# Patient Record
Sex: Male | Born: 1985 | Race: Black or African American | Hispanic: No | Marital: Married | Smoking: Former smoker
Health system: Southern US, Community
[De-identification: ages and names within clinical notes are randomized; demographics above are authoritative.]

## PROBLEM LIST (undated history)

## (undated) DIAGNOSIS — I10 Essential (primary) hypertension: Secondary | ICD-10-CM

## (undated) DIAGNOSIS — R7303 Prediabetes: Secondary | ICD-10-CM

---
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
## 2006-06-06 ENCOUNTER — Other Ambulatory Visit: Payer: Self-pay

## 2006-06-06 ENCOUNTER — Emergency Department: Payer: Self-pay | Admitting: Emergency Medicine

## 2007-10-25 ENCOUNTER — Emergency Department: Payer: Self-pay | Admitting: Emergency Medicine

## 2008-06-29 ENCOUNTER — Emergency Department: Payer: Self-pay | Admitting: Emergency Medicine

## 2008-08-26 ENCOUNTER — Emergency Department: Payer: Self-pay | Admitting: Emergency Medicine

## 2009-03-27 ENCOUNTER — Emergency Department: Payer: Self-pay | Admitting: Emergency Medicine

## 2009-08-21 ENCOUNTER — Emergency Department: Payer: Self-pay | Admitting: Unknown Physician Specialty

## 2010-12-24 ENCOUNTER — Emergency Department: Payer: Self-pay | Admitting: Emergency Medicine

## 2011-03-02 ENCOUNTER — Emergency Department: Payer: Self-pay | Admitting: *Deleted

## 2011-11-15 ENCOUNTER — Emergency Department: Payer: Self-pay | Admitting: Emergency Medicine

## 2011-11-17 LAB — BETA STREP CULTURE(ARMC)

## 2013-03-24 ENCOUNTER — Emergency Department: Payer: Self-pay | Admitting: Emergency Medicine

## 2013-07-16 ENCOUNTER — Emergency Department: Payer: Self-pay | Admitting: Emergency Medicine

## 2013-08-28 ENCOUNTER — Emergency Department: Payer: Self-pay | Admitting: Emergency Medicine

## 2013-08-28 LAB — URINALYSIS, COMPLETE
Bacteria: NONE SEEN
Bilirubin,UR: NEGATIVE
Blood: NEGATIVE
Glucose,UR: NEGATIVE mg/dL (ref 0–75)
Ketone: NEGATIVE
Leukocyte Esterase: NEGATIVE
Nitrite: NEGATIVE
PROTEIN: NEGATIVE
Ph: 5 (ref 4.5–8.0)
RBC, UR: NONE SEEN /HPF (ref 0–5)
Specific Gravity: 1.034 (ref 1.003–1.030)
Squamous Epithelial: <1

## 2013-08-28 LAB — COMPREHENSIVE METABOLIC PANEL
ANION GAP: 5 — AB (ref 7–16)
Albumin: 4 g/dL (ref 3.4–5.0)
Alkaline Phosphatase: 77 U/L
BUN: 22 mg/dL — ABNORMAL HIGH (ref 7–18)
Bilirubin,Total: 0.3 mg/dL (ref 0.2–1.0)
CALCIUM: 8.9 mg/dL (ref 8.5–10.1)
CO2: 28 mmol/L (ref 21–32)
Chloride: 104 mmol/L (ref 98–107)
Creatinine: 0.97 mg/dL (ref 0.60–1.30)
EGFR (African American): >60
EGFR (Non-African Amer.): >60
Glucose: 98 mg/dL (ref 65–99)
OSMOLALITY: 277 (ref 275–301)
POTASSIUM: 3.9 mmol/L (ref 3.5–5.1)
SGOT(AST): 21 U/L (ref 15–37)
SGPT (ALT): 33 U/L (ref 12–78)
Sodium: 137 mmol/L (ref 136–145)
Total Protein: 8.4 g/dL — ABNORMAL HIGH (ref 6.4–8.2)

## 2013-08-28 LAB — CBC WITH DIFFERENTIAL/PLATELET
BASOS ABS: 0 10*3/uL (ref 0.0–0.1)
BASOS PCT: 0.7 %
Eosinophil #: 0.3 10*3/uL (ref 0.0–0.7)
Eosinophil %: 3.7 %
HCT: 47 % (ref 40.0–52.0)
HGB: 15 g/dL (ref 13.0–18.0)
Lymphocyte #: 1.9 10*3/uL (ref 1.0–3.6)
Lymphocyte %: 27.9 %
MCH: 27.9 pg (ref 26.0–34.0)
MCHC: 32 g/dL (ref 32.0–36.0)
MCV: 87 fL (ref 80–100)
Monocyte #: 0.7 x10 3/mm (ref 0.2–1.0)
Monocyte %: 9.9 %
Neutrophil #: 4 10*3/uL (ref 1.4–6.5)
Neutrophil %: 57.8 %
Platelet: 239 10*3/uL (ref 150–440)
RBC: 5.38 10*6/uL (ref 4.40–5.90)
RDW: 14.3 % (ref 11.5–14.5)
WBC: 6.9 10*3/uL (ref 3.8–10.6)

## 2013-08-28 LAB — CK TOTAL AND CKMB (NOT AT ARMC)
CK, TOTAL: 370 U/L — AB
CK-MB: 3 ng/mL (ref 0.5–3.6)

## 2013-10-18 ENCOUNTER — Emergency Department: Payer: Self-pay | Admitting: Emergency Medicine

## 2013-10-18 LAB — URINALYSIS, COMPLETE
BACTERIA: NONE SEEN
Bilirubin,UR: NEGATIVE
Blood: NEGATIVE
GLUCOSE, UR: NEGATIVE mg/dL (ref 0–75)
Ketone: NEGATIVE
Leukocyte Esterase: NEGATIVE
Nitrite: NEGATIVE
Ph: 6 (ref 4.5–8.0)
Protein: NEGATIVE
RBC,UR: NONE SEEN /HPF (ref 0–5)
SPECIFIC GRAVITY: 1.017 (ref 1.003–1.030)
Squamous Epithelial: NONE SEEN
WBC UR: 1 /HPF (ref 0–5)

## 2013-10-18 LAB — DRUG SCREEN, URINE
Amphetamines, Ur Screen: NEGATIVE (ref ?–1000)
Barbiturates, Ur Screen: NEGATIVE (ref ?–200)
Benzodiazepine, Ur Scrn: NEGATIVE (ref ?–200)
CANNABINOID 50 NG, UR ~~LOC~~: POSITIVE (ref ?–50)
COCAINE METABOLITE, UR ~~LOC~~: NEGATIVE (ref ?–300)
MDMA (ECSTASY) UR SCREEN: NEGATIVE (ref ?–500)
Methadone, Ur Screen: NEGATIVE (ref ?–300)
Opiate, Ur Screen: NEGATIVE (ref ?–300)
Phencyclidine (PCP) Ur S: NEGATIVE (ref ?–25)
Tricyclic, Ur Screen: NEGATIVE (ref ?–1000)

## 2013-10-18 LAB — CBC
HCT: 45.6 % (ref 40.0–52.0)
HGB: 14.8 g/dL (ref 13.0–18.0)
MCH: 28.1 pg (ref 26.0–34.0)
MCHC: 32.5 g/dL (ref 32.0–36.0)
MCV: 87 fL (ref 80–100)
PLATELETS: 243 10*3/uL (ref 150–440)
RBC: 5.26 10*6/uL (ref 4.40–5.90)
RDW: 14.2 % (ref 11.5–14.5)
WBC: 8 10*3/uL (ref 3.8–10.6)

## 2013-10-18 LAB — COMPREHENSIVE METABOLIC PANEL
ANION GAP: 6 — AB (ref 7–16)
AST: 14 U/L — AB (ref 15–37)
Albumin: 4 g/dL (ref 3.4–5.0)
Alkaline Phosphatase: 75 U/L
BUN: 14 mg/dL (ref 7–18)
Bilirubin,Total: 0.3 mg/dL (ref 0.2–1.0)
CALCIUM: 9.3 mg/dL (ref 8.5–10.1)
CO2: 26 mmol/L (ref 21–32)
Chloride: 106 mmol/L (ref 98–107)
Creatinine: 1.18 mg/dL (ref 0.60–1.30)
EGFR (African American): >60
Glucose: 99 mg/dL (ref 65–99)
Osmolality: 276 (ref 275–301)
POTASSIUM: 3.3 mmol/L — AB (ref 3.5–5.1)
SGPT (ALT): 31 U/L
SODIUM: 138 mmol/L (ref 136–145)
Total Protein: 8.5 g/dL — ABNORMAL HIGH (ref 6.4–8.2)

## 2013-10-19 ENCOUNTER — Emergency Department: Payer: Self-pay | Admitting: Emergency Medicine

## 2013-10-19 LAB — CBC WITH DIFFERENTIAL/PLATELET
BASOS ABS: 0 10*3/uL (ref 0.0–0.1)
BASOS PCT: 0.6 %
Eosinophil #: 0.1 10*3/uL (ref 0.0–0.7)
Eosinophil %: 0.7 %
HCT: 46.9 % (ref 40.0–52.0)
HGB: 15.2 g/dL (ref 13.0–18.0)
Lymphocyte #: 1.9 10*3/uL (ref 1.0–3.6)
Lymphocyte %: 23.3 %
MCH: 28 pg (ref 26.0–34.0)
MCHC: 32.5 g/dL (ref 32.0–36.0)
MCV: 86 fL (ref 80–100)
MONO ABS: 0.6 x10 3/mm (ref 0.2–1.0)
Monocyte %: 7.6 %
NEUTROS PCT: 67.8 %
Neutrophil #: 5.6 10*3/uL (ref 1.4–6.5)
Platelet: 237 10*3/uL (ref 150–440)
RBC: 5.43 10*6/uL (ref 4.40–5.90)
RDW: 14 % (ref 11.5–14.5)
WBC: 8.3 10*3/uL (ref 3.8–10.6)

## 2013-10-19 LAB — COMPREHENSIVE METABOLIC PANEL
ALBUMIN: 4.1 g/dL (ref 3.4–5.0)
AST: 16 U/L (ref 15–37)
Alkaline Phosphatase: 73 U/L
Anion Gap: 7 (ref 7–16)
BUN: 8 mg/dL (ref 7–18)
Bilirubin,Total: 0.4 mg/dL (ref 0.2–1.0)
CHLORIDE: 107 mmol/L (ref 98–107)
Calcium, Total: 9.5 mg/dL (ref 8.5–10.1)
Co2: 26 mmol/L (ref 21–32)
Creatinine: 1.09 mg/dL (ref 0.60–1.30)
EGFR (Non-African Amer.): >60
GLUCOSE: 114 mg/dL — AB (ref 65–99)
OSMOLALITY: 279 (ref 275–301)
Potassium: 3.2 mmol/L — ABNORMAL LOW (ref 3.5–5.1)
SGPT (ALT): 27 U/L
Sodium: 140 mmol/L (ref 136–145)
TOTAL PROTEIN: 8.6 g/dL — AB (ref 6.4–8.2)

## 2013-10-19 LAB — TROPONIN I: Troponin-I: <0.02 ng/mL

## 2013-10-23 ENCOUNTER — Encounter: Payer: Self-pay | Admitting: *Deleted

## 2013-10-23 ENCOUNTER — Ambulatory Visit: Payer: Self-pay | Admitting: Cardiovascular Disease

## 2013-10-29 ENCOUNTER — Emergency Department: Payer: Self-pay | Admitting: Emergency Medicine

## 2013-10-29 LAB — CBC WITH DIFFERENTIAL/PLATELET
BASOS ABS: 0 10*3/uL (ref 0.0–0.1)
BASOS PCT: 0.5 %
EOS ABS: 0.1 10*3/uL (ref 0.0–0.7)
EOS PCT: 1.7 %
HCT: 47.6 % (ref 40.0–52.0)
HGB: 15.4 g/dL (ref 13.0–18.0)
LYMPHS ABS: 1.5 10*3/uL (ref 1.0–3.6)
LYMPHS PCT: 23.2 %
MCH: 28.1 pg (ref 26.0–34.0)
MCHC: 32.3 g/dL (ref 32.0–36.0)
MCV: 87 fL (ref 80–100)
MONO ABS: 0.6 x10 3/mm (ref 0.2–1.0)
Monocyte %: 8.7 %
NEUTROS ABS: 4.3 10*3/uL (ref 1.4–6.5)
Neutrophil %: 65.9 %
Platelet: 268 10*3/uL (ref 150–440)
RBC: 5.47 10*6/uL (ref 4.40–5.90)
RDW: 14.1 % (ref 11.5–14.5)
WBC: 6.5 10*3/uL (ref 3.8–10.6)

## 2013-10-29 LAB — URINALYSIS, COMPLETE
BACTERIA: NONE SEEN
Bilirubin,UR: NEGATIVE
Blood: NEGATIVE
GLUCOSE, UR: NEGATIVE mg/dL (ref 0–75)
Ketone: NEGATIVE
Leukocyte Esterase: NEGATIVE
Nitrite: NEGATIVE
PH: 7 (ref 4.5–8.0)
Protein: NEGATIVE
RBC,UR: NONE SEEN /HPF (ref 0–5)
SPECIFIC GRAVITY: 1.001 (ref 1.003–1.030)
SQUAMOUS EPITHELIAL: NONE SEEN
WBC UR: NONE SEEN /HPF (ref 0–5)

## 2013-10-29 LAB — BASIC METABOLIC PANEL
Anion Gap: 7 (ref 7–16)
BUN: 9 mg/dL (ref 7–18)
CO2: 26 mmol/L (ref 21–32)
CREATININE: 1.05 mg/dL (ref 0.60–1.30)
Calcium, Total: 9.2 mg/dL (ref 8.5–10.1)
Chloride: 105 mmol/L (ref 98–107)
EGFR (African American): >60
GLUCOSE: 94 mg/dL (ref 65–99)
OSMOLALITY: 274 (ref 275–301)
POTASSIUM: 3.6 mmol/L (ref 3.5–5.1)
Sodium: 138 mmol/L (ref 136–145)

## 2013-10-29 LAB — TROPONIN I: Troponin-I: <0.02 ng/mL

## 2013-11-10 ENCOUNTER — Encounter (INDEPENDENT_AMBULATORY_CARE_PROVIDER_SITE_OTHER): Payer: Self-pay

## 2013-11-10 ENCOUNTER — Ambulatory Visit (INDEPENDENT_AMBULATORY_CARE_PROVIDER_SITE_OTHER): Payer: Self-pay | Admitting: Cardiovascular Disease

## 2013-11-10 ENCOUNTER — Encounter: Payer: Self-pay | Admitting: Cardiovascular Disease

## 2013-11-10 VITALS — BP 138/92 | HR 74 | Ht 75.0 in | Wt 246.8 lb

## 2013-11-10 DIAGNOSIS — Z7189 Other specified counseling: Secondary | ICD-10-CM

## 2013-11-10 DIAGNOSIS — R001 Bradycardia, unspecified: Secondary | ICD-10-CM

## 2013-11-10 DIAGNOSIS — R0789 Other chest pain: Secondary | ICD-10-CM

## 2013-11-10 DIAGNOSIS — R42 Dizziness and giddiness: Secondary | ICD-10-CM

## 2013-11-10 DIAGNOSIS — I498 Other specified cardiac arrhythmias: Secondary | ICD-10-CM

## 2013-11-10 DIAGNOSIS — IMO0001 Reserved for inherently not codable concepts without codable children: Secondary | ICD-10-CM | POA: Insufficient documentation

## 2013-11-10 DIAGNOSIS — R030 Elevated blood-pressure reading, without diagnosis of hypertension: Secondary | ICD-10-CM

## 2013-11-10 DIAGNOSIS — Z7689 Persons encountering health services in other specified circumstances: Secondary | ICD-10-CM

## 2013-11-10 DIAGNOSIS — I499 Cardiac arrhythmia, unspecified: Secondary | ICD-10-CM

## 2013-11-10 NOTE — Assessment & Plan Note (Signed)
The patient was reported to have bradycardia in the emergency room. He has been having lightheadedness of unclear etiology. The lightheadedness could be completely unrelated. However, in order to establish the connection, I requested a 48-hour Holter monitor given that his symptoms are almost daily. I strongly advised him to establish with a primary care physician to followup on his high blood pressure and other medical issues or etiologies for his lightheadedness. His cardiac exam is normal and baseline ECG is unremarkable.

## 2013-11-10 NOTE — Patient Instructions (Signed)
Your physician has recommended that you wear a holter monitor. Holter monitors are medical devices that record the heart's electrical activity. Doctors most often use these monitors to diagnose arrhythmias. Arrhythmias are problems with the speed or rhythm of the heartbeat. The monitor is a small, portable device. You can wear one while you do your normal daily activities. This is usually used to diagnose what is causing palpitations/syncope (passing out).  You have been referred to the [facility]. I will call you with the date and time.   Please follow low sodium diet instructions   Your physician recommends that you schedule a follow-up appointment in:  As needed with Dr. Kirke Corin

## 2013-11-10 NOTE — Assessment & Plan Note (Signed)
I discussed with the patient the importance of low sodium diet, regular exercise and weight loss. If blood pressure continues to be elevated after a period of lifestyle changes, medical therapy is warranted. He was provided with information about low-sodium diet.

## 2013-11-10 NOTE — Progress Notes (Signed)
HPI  This is a 28 year old African American man who was referred from the emergency room at [facility] for evaluation of dizziness and bradycardia. He is not aware of any previous cardiac history or chronic medical conditions. He has not seen a physician in many years. He has noticed elevated blood pressure every time he goes to the emergency room but has not been on any medications. He quit smoking one month ago and used to smoke one pack per day. He drinks alcohol occasionally and uses marijuana as well. He denies any other recreational drug use. He is currently unemployed. He denies any family history of premature coronary artery disease. He went to the emergency room recently due to dizziness described as lightheadedness with no palpitations, chest pain or shortness of breath. He had no syncope or presyncope. He was noted to have fluctuating heart rate while on the monitor with episodes of bradycardia which was not recorded. He was thus referred to [place] for evaluation. His labs were overall unremarkable except for mild hypokalemia with a potassium of 3.3. Total protein was borderline elevated at 8.5. Drug screen was negative. Troponin was normal. EKG showed no acute changes. Chest x-ray was unremarkable.  No Known Allergies   No current outpatient prescriptions on file prior to visit.   No current facility-administered medications on file prior to visit.     History reviewed. No pertinent past medical history.   History reviewed. No pertinent past surgical history.   Family History  Problem Relation Age of Onset  . Asthma Brother      History   Social History  . Marital Status: Single    Spouse Name: N/A    Number of Children: N/A  . Years of Education: N/A   Occupational History  . Not on file.   Social History Main Topics  . Smoking status: Former Smoker -- 1.00 packs/day for 10 years    Types: Cigarettes  . Smokeless tobacco: Not on file  . Alcohol Use: Yes     Comment:  rare   . Drug Use: Not on file     Comment: Patient recently quit smoking marjuana in July 2015; smoked marjuana since high school.  . Sexual Activity: Not on file   Other Topics Concern  . Not on file   Social History Narrative  . No narrative on file     ROS A 10 point review of system was performed. It is negative other than that mentioned in the history of present illness.   PHYSICAL EXAM   BP 138/92  Pulse 74  Ht  (1.905 m)  Wt 246 lb 12 oz (111.925 kg)  BMI 30.84 kg/m2 Constitutional: He is oriented to person, place, and time. He appears well-developed and well-nourished. No distress.  HENT: No nasal discharge.  Head: Normocephalic and atraumatic.  Eyes: Pupils are equal and round.  No discharge. Neck: Normal range of motion. Neck supple. No JVD present. No thyromegaly present.  Cardiovascular: Normal rate, regular rhythm, normal heart sounds. Exam reveals no gallop and no friction rub. No murmur heard.  Pulmonary/Chest: Effort normal and breath sounds normal. No stridor. No respiratory distress. He has no wheezes. He has no rales. He exhibits no tenderness.  Abdominal: Soft. Bowel sounds are normal. He exhibits no distension. There is no tenderness. There is no rebound and no guarding.  Musculoskeletal: Normal range of motion. He exhibits no edema and no tenderness.  Neurological: He is alert and oriented to person, place, and time.  Coordination normal.  Skin: Skin is warm and dry. No rash noted. He is not diaphoretic. No erythema. No pallor.  Psychiatric: He has a normal mood and affect. His behavior is normal. Judgment and thought content normal.       ZOX:WRUEAV sinus rhythm Normal EKG   ASSESSMENT AND PLAN

## 2013-11-11 ENCOUNTER — Telehealth: Payer: Self-pay | Admitting: *Deleted

## 2013-11-11 NOTE — Telephone Encounter (Signed)
LVM at [facility] to refer patient

## 2013-11-13 DIAGNOSIS — I498 Other specified cardiac arrhythmias: Secondary | ICD-10-CM

## 2013-12-03 NOTE — Telephone Encounter (Signed)
LVM at [facility] 9/23

## 2013-12-10 ENCOUNTER — Telehealth: Payer: Self-pay | Admitting: *Deleted

## 2013-12-10 NOTE — Telephone Encounter (Signed)
Patient returned call. Patient verbalized understanding.

## 2013-12-10 NOTE — Telephone Encounter (Signed)
LVM for patient to inform him that per Dr. Kirke CorinArida his event monitor showed:  NSR with Sinus arrythmia and occasional pac's  Mild sinus bradycardia in the morning hours  No significant arrythmias

## 2013-12-10 NOTE — Telephone Encounter (Signed)
Patient has appt with [facility] 12/30/13 at 1:30

## 2013-12-11 ENCOUNTER — Encounter: Payer: Self-pay | Admitting: *Deleted

## 2013-12-12 ENCOUNTER — Other Ambulatory Visit: Payer: Self-pay

## 2013-12-12 ENCOUNTER — Encounter (INDEPENDENT_AMBULATORY_CARE_PROVIDER_SITE_OTHER): Payer: Self-pay

## 2013-12-12 DIAGNOSIS — R001 Bradycardia, unspecified: Secondary | ICD-10-CM

## 2014-03-19 ENCOUNTER — Emergency Department: Payer: Self-pay | Admitting: Student

## 2014-06-16 ENCOUNTER — Emergency Department: Admit: 2014-06-16 | Disposition: A | Discharge status: Home / Self Care | Payer: Self-pay | Admitting: Student

## 2014-08-23 ENCOUNTER — Encounter: Payer: Self-pay | Admitting: Emergency Medicine

## 2014-08-23 DIAGNOSIS — Z87891 Personal history of nicotine dependence: Secondary | ICD-10-CM | POA: Insufficient documentation

## 2014-08-23 DIAGNOSIS — R1030 Lower abdominal pain, unspecified: Secondary | ICD-10-CM | POA: Insufficient documentation

## 2014-08-23 DIAGNOSIS — E860 Dehydration: Secondary | ICD-10-CM | POA: Insufficient documentation

## 2014-08-23 NOTE — ED Notes (Signed)
Abdominal pain, dry mouth, feeling of dehydration, clear urine. Mid lower abdominal pain.  Started 1 hour ago.  CBG 93.  VSS

## 2014-08-24 ENCOUNTER — Emergency Department
Admission: EM | Admit: 2014-08-24 | Discharge: 2014-08-24 | Discharge status: Against Medical Advice | Payer: Self-pay | Attending: Emergency Medicine | Admitting: Emergency Medicine

## 2014-08-24 HISTORY — DX: Prediabetes: R73.03

## 2014-08-24 LAB — URINALYSIS COMPLETE WITH MICROSCOPIC (ARMC ONLY)
Bacteria, UA: NONE SEEN
Bilirubin Urine: NEGATIVE
Glucose, UA: NEGATIVE mg/dL
Hgb urine dipstick: NEGATIVE
Ketones, ur: NEGATIVE mg/dL
LEUKOCYTES UA: NEGATIVE
Nitrite: NEGATIVE
Protein, ur: NEGATIVE mg/dL
SQUAMOUS EPITHELIAL / LPF: NONE SEEN
Specific Gravity, Urine: 1.019 (ref 1.005–1.030)
pH: 6 (ref 5.0–8.0)

## 2014-08-24 LAB — CBC WITH DIFFERENTIAL/PLATELET
BASOS ABS: 0 10*3/uL (ref 0–0.1)
Basophils Relative: 1 %
EOS PCT: 2 %
Eosinophils Absolute: 0.1 10*3/uL (ref 0–0.7)
HCT: 47.4 % (ref 40.0–52.0)
HEMOGLOBIN: 15.5 g/dL (ref 13.0–18.0)
LYMPHS ABS: 2.8 10*3/uL (ref 1.0–3.6)
Lymphocytes Relative: 52 %
MCH: 27.5 pg (ref 26.0–34.0)
MCHC: 32.7 g/dL (ref 32.0–36.0)
MCV: 84 fL (ref 80.0–100.0)
Monocytes Absolute: 0.6 10*3/uL (ref 0.2–1.0)
Monocytes Relative: 12 %
NEUTROS PCT: 33 %
Neutro Abs: 1.7 10*3/uL (ref 1.4–6.5)
Platelets: 234 10*3/uL (ref 150–440)
RBC: 5.64 MIL/uL (ref 4.40–5.90)
RDW: 15 % — AB (ref 11.5–14.5)
WBC: 5.3 10*3/uL (ref 3.8–10.6)

## 2014-08-24 LAB — COMPREHENSIVE METABOLIC PANEL
ALBUMIN: 4.5 g/dL (ref 3.5–5.0)
ALT: 42 U/L (ref 17–63)
AST: 33 U/L (ref 15–41)
Alkaline Phosphatase: 77 U/L (ref 38–126)
Anion gap: 8 (ref 5–15)
BUN: 19 mg/dL (ref 6–20)
CALCIUM: 9.6 mg/dL (ref 8.9–10.3)
CO2: 25 mmol/L (ref 22–32)
Chloride: 105 mmol/L (ref 101–111)
Creatinine, Ser: 1.05 mg/dL (ref 0.61–1.24)
GFR calc non Af Amer: >60 mL/min (ref >60)
GLUCOSE: 105 mg/dL — AB (ref 65–99)
Potassium: 3.7 mmol/L (ref 3.5–5.1)
Sodium: 138 mmol/L (ref 135–145)
TOTAL PROTEIN: 8.4 g/dL — AB (ref 6.5–8.1)
Total Bilirubin: 0.5 mg/dL (ref 0.3–1.2)

## 2014-08-24 LAB — LIPASE, BLOOD: Lipase: 57 U/L — ABNORMAL HIGH (ref 22–51)

## 2014-08-24 LAB — GLUCOSE, CAPILLARY: Glucose-Capillary: 93 mg/dL (ref 65–99)

## 2014-08-25 ENCOUNTER — Telehealth: Payer: Self-pay | Admitting: Emergency Medicine

## 2014-08-25 NOTE — ED Notes (Signed)
Called patient due to lwot to inquire about condition and follow up plans.  The person who answered was trying to get patient to come to phone, but he never came.  Phone got cut off.

## 2015-02-10 ENCOUNTER — Emergency Department
Admission: EM | Admit: 2015-02-10 | Discharge: 2015-02-10 | Disposition: A | Discharge status: Home / Self Care | Payer: Self-pay | Attending: Emergency Medicine | Admitting: Emergency Medicine

## 2015-02-10 DIAGNOSIS — Y9389 Activity, other specified: Secondary | ICD-10-CM | POA: Insufficient documentation

## 2015-02-10 DIAGNOSIS — Z87891 Personal history of nicotine dependence: Secondary | ICD-10-CM | POA: Insufficient documentation

## 2015-02-10 DIAGNOSIS — S39012A Strain of muscle, fascia and tendon of lower back, initial encounter: Secondary | ICD-10-CM | POA: Insufficient documentation

## 2015-02-10 DIAGNOSIS — Y9289 Other specified places as the place of occurrence of the external cause: Secondary | ICD-10-CM | POA: Insufficient documentation

## 2015-02-10 DIAGNOSIS — Z79899 Other long term (current) drug therapy: Secondary | ICD-10-CM | POA: Insufficient documentation

## 2015-02-10 DIAGNOSIS — Y990 Civilian activity done for income or pay: Secondary | ICD-10-CM | POA: Insufficient documentation

## 2015-02-10 DIAGNOSIS — X58XXXA Exposure to other specified factors, initial encounter: Secondary | ICD-10-CM | POA: Insufficient documentation

## 2015-02-10 MED ORDER — CYCLOBENZAPRINE HCL 10 MG PO TABS: 10.0000 mg | ORAL_TABLET | Freq: Three times a day (TID) | ORAL | Status: DC | PRN

## 2015-02-10 MED ORDER — IBUPROFEN 800 MG PO TABS: 800.0000 mg | ORAL_TABLET | Freq: Three times a day (TID) | ORAL | Status: DC | PRN

## 2015-02-10 NOTE — Discharge Instructions (Signed)
Muscle Strain  A muscle strain is an injury that occurs when a muscle is stretched beyond its normal length. Usually a small number of muscle fibers are torn when this happens. Muscle strain is rated in degrees. First-degree strains have the least amount of muscle fiber tearing and pain. Second-degree and third-degree strains have increasingly more tearing and pain.   Usually, recovery from muscle strain takes 1-2 weeks. Complete healing takes 5-6 weeks.   CAUSES   Muscle strain happens when a sudden, violent force placed on a muscle stretches it too far. This may occur with lifting, sports, or a fall.   RISK FACTORS  Muscle strain is especially common in athletes.   SIGNS AND SYMPTOMS  At the site of the muscle strain, there may be:   Pain.   Bruising.   Swelling.   Difficulty using the muscle due to pain or lack of normal function.  DIAGNOSIS   Your health care provider will perform a physical exam and ask about your medical history.  TREATMENT   Often, the best treatment for a muscle strain is resting, icing, and applying cold compresses to the injured area.   HOME CARE INSTRUCTIONS    Use the PRICE method of treatment to promote muscle healing during the first 2-3 days after your injury. The PRICE method involves:    Protecting the muscle from being injured again.    Restricting your activity and resting the injured body part.    Icing your injury. To do this, put ice in a plastic bag. Place a towel between your skin and the bag. Then, apply the ice and leave it on from 15-20 minutes each hour. After the third day, switch to moist heat packs.    Apply compression to the injured area with a splint or elastic bandage. Be careful not to wrap it too tightly. This may interfere with blood circulation or increase swelling.    Elevate the injured body part above the level of your heart as often as you can.   Only take over-the-counter or prescription medicines for pain, discomfort, or fever as directed by your  health care provider.   Warming up prior to exercise helps to prevent future muscle strains.  SEEK MEDICAL CARE IF:    You have increasing pain or swelling in the injured area.   You have numbness, tingling, or a significant loss of strength in the injured area.  MAKE SURE YOU:    Understand these instructions.   Will watch your condition.   Will get help right away if you are not doing well or get worse.     This information is not intended to replace advice given to you by your health care provider. Make sure you discuss any questions you have with your health care provider.     Document Released: 02/27/2005 Document Revised: 12/18/2012 Document Reviewed: 09/26/2012  Elsevier Interactive Patient Education 2016 Elsevier Inc[employer]    Lumbosacral Strain  Lumbosacral strain is a strain of any of the parts that make up your lumbosacral vertebrae. Your lumbosacral vertebrae are the bones that make up the lower third of your backbone. Your lumbosacral vertebrae are held together by muscles and tough, fibrous tissue (ligaments).   CAUSES   A sudden blow to your back can cause lumbosacral strain. Also, anything that causes an excessive stretch of the muscles in the low back can cause this strain. This is typically seen when people exert themselves strenuously, fall, lift heavy objects, bend, or crouch   repeatedly.  RISK FACTORS   Physically demanding work.   Participation in pushing or pulling sports or sports that require a sudden twist of the back (tennis, golf, baseball).   Weight lifting.   Excessive lower back curvature.   Forward-tilted pelvis.   Weak back or abdominal muscles or both.   Tight hamstrings.  SIGNS AND SYMPTOMS   Lumbosacral strain may cause pain in the area of your injury or pain that moves (radiates) down your leg.   DIAGNOSIS  Your health care provider can often diagnose lumbosacral strain through a physical exam. In some cases, you may need tests such as X-ray exams.   TREATMENT   Treatment  for your lower back injury depends on many factors that your clinician will have to evaluate. However, most treatment will include the use of anti-inflammatory medicines.  HOME CARE INSTRUCTIONS    Avoid hard physical activities (tennis, racquetball, waterskiing) if you are not in proper physical condition for it. This may aggravate or create problems.   If you have a back problem, avoid sports requiring sudden body movements. Swimming and walking are generally safer activities.   Maintain good posture.   Maintain a healthy weight.   For acute conditions, you may put ice on the injured area.    Put ice in a plastic bag.    Place a towel between your skin and the bag.    Leave the ice on for 20 minutes, 2-3 times a day.   When the low back starts healing, stretching and strengthening exercises may be recommended.  SEEK MEDICAL CARE IF:   Your back pain is getting worse.   You experience severe back pain not relieved with medicines.  SEEK IMMEDIATE MEDICAL CARE IF:    You have numbness, tingling, weakness, or problems with the use of your arms or legs.   There is a change in bowel or bladder control.   You have increasing pain in any area of the body, including your belly (abdomen).   You notice shortness of breath, dizziness, or feel faint.   You feel sick to your stomach (nauseous), are throwing up (vomiting), or become sweaty.   You notice discoloration of your toes or legs, or your feet get very cold.  MAKE SURE YOU:    Understand these instructions.   Will watch your condition.   Will get help right away if you are not doing well or get worse.     This information is not intended to replace advice given to you by your health care provider. Make sure you discuss any questions you have with your health care provider.     Document Released: 12/07/2004 Document Revised: 03/20/2014 Document Reviewed: 10/16/2012  Elsevier Interactive Patient Education 2016 Elsevier Inc.

## 2015-02-10 NOTE — ED Notes (Signed)
Developed mid to lower back pain about 3 days ago  W/o injury states pain is non radiating. Denies any n/v/d fever or urinary sxs'

## 2015-02-10 NOTE — ED Notes (Signed)
Pt co back Pain states since Monday unsure of injury, states worse when he moves or bends over.

## 2015-02-10 NOTE — ED Provider Notes (Signed)
[facility] Emergency Department Provider Note  ____________________________________________  Time seen: Approximately 7:08 AM  I have reviewed the triage vital signs and the nursing notes.   HISTORY  Chief Complaint Back Pain   HPI Michael Shaw is a 29 y.o. male 's for evaluation of low back pain 3 days. 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   Past Medical History  Diagnosis Date  . Prediabetes     Patient Active Problem List   Diagnosis Date Noted  . Bradycardia 11/10/2013  . Elevated blood pressure 11/10/2013    No past surgical history on file.  Current Outpatient Rx  Name  Route  Sig  Dispense  Refill  . amLODipine (NORVASC) 5 MG tablet   Oral   Take 5 mg by mouth daily.         . cyclobenzaprine (FLEXERIL) 10 MG tablet   Oral   Take 1 tablet (10 mg total) by mouth every 8 (eight) hours as needed for muscle spasms.   30 tablet   1   . ibuprofen (ADVIL,MOTRIN) 800 MG tablet   Oral   Take 1 tablet (800 mg total) by mouth every 8 (eight) hours as needed.   30 tablet   0     Allergies Review of patient's allergies indicates no known allergies.  Family History  Problem Relation Age of Onset  . Asthma Brother     Social History Social History  Substance Use Topics  . Smoking status: Former Smoker -- 1.00 packs/day for 10 years    Types: Cigarettes  . Smokeless tobacco: Not on file  . Alcohol Use: Yes     Comment: rare     Review of Systems Constitutional: No fever/chills Eyes: No visual changes. ENT: No sore throat. Cardiovascular: Denies chest pain. Respiratory: Denies shortness of breath. Gastrointestinal: No abdominal pain.  No nausea, no vomiting.  No diarrhea.  No constipation. Genitourinary: Negative for dysuria. Musculoskeletal: Positive for mid-low back pain. Skin:  Negative for rash. Neurological: Negative for headaches, focal weakness or numbness.  10-point ROS otherwise negative.  ____________________________________________   PHYSICAL EXAM:  VITAL SIGNS: ED Triage Vitals  Enc Vitals Group     BP 02/10/15 0653 124/90 mmHg     Pulse Rate 02/10/15 0653 82     Resp 02/10/15 0653 18     Temp 02/10/15 0653 98.2 F (36.8 C)     Temp Source 02/10/15 0653 Oral     SpO2 02/10/15 0653 99 %     Weight 02/10/15 0653 234 lb (106.142 kg)     Height 02/10/15 0653 6\' 3"  (1.905 m)     Head Cir --      Peak Flow --      Pain Score 02/10/15 0653 7     Pain Loc --      Pain Edu? --      Excl. in GC? --     Constitutional: Alert and oriented. Well appearing and in no acute distress.  Cardiovascular: Normal rate, regular rhythm. Grossly normal heart sounds.  Good peripheral circulation. Respiratory: Normal respiratory effort.  No retractions. Lungs CTAB. Gastrointestinal: Soft and nontender. No distention. No abdominal bruits. No CVA tenderness. Musculoskeletal: Low back pain with straight leg positive at approximately 40. Positive for lumbar and thoracic paraspinal tenderness. Neurologic:  Normal speech and language. No  gross focal neurologic deficits are appreciated. No gait instability. Skin:  Skin is warm, dry and intact. No rash noted. Psychiatric: Mood and affect are normal. Speech and behavior are normal.  ____________________________________________   LABS (all labs ordered are listed, but only abnormal results are displayed)  Labs Reviewed - No data to display ____________________________________________   RADIOLOGY  Deferred at this time ____________________________________________   PROCEDURES  Procedure(s) performed: None  Critical Care performed: No  ____________________________________________   INITIAL IMPRESSION / ASSESSMENT AND PLAN / ED COURSE  Pertinent labs & imaging results that were available during my care of  the patient were reviewed by me and considered in my medical decision making (see chart for details).  Acute back strain. Rx given for Flexeril 10 mg 3 times a day and Motrin) milligrams 3 times a day. Work excuse given times today. Patient follow-up with PCP or return to the ER with any worsening symptomology. Patient voices no other emergency medical complaints at this time. ____________________________________________   FINAL CLINICAL IMPRESSION(S) / ED DIAGNOSES  Final diagnoses:  Back strain, initial encounter      Evangeline Dakin, [name] 02/10/15 4098  Arnaldo Natal, MD 02/12/15 [phone]

## 2015-03-03 ENCOUNTER — Emergency Department
Admission: EM | Admit: 2015-03-03 | Discharge: 2015-03-03 | Disposition: A | Discharge status: Home / Self Care | Payer: Self-pay | Attending: Emergency Medicine | Admitting: Emergency Medicine

## 2015-03-03 ENCOUNTER — Encounter: Payer: Self-pay | Admitting: Emergency Medicine

## 2015-03-03 DIAGNOSIS — R531 Weakness: Secondary | ICD-10-CM | POA: Insufficient documentation

## 2015-03-03 DIAGNOSIS — I10 Essential (primary) hypertension: Secondary | ICD-10-CM | POA: Insufficient documentation

## 2015-03-03 DIAGNOSIS — Z87891 Personal history of nicotine dependence: Secondary | ICD-10-CM | POA: Insufficient documentation

## 2015-03-03 DIAGNOSIS — R5383 Other fatigue: Secondary | ICD-10-CM | POA: Insufficient documentation

## 2015-03-03 DIAGNOSIS — Z79899 Other long term (current) drug therapy: Secondary | ICD-10-CM | POA: Insufficient documentation

## 2015-03-03 HISTORY — DX: Essential (primary) hypertension: I10

## 2015-03-03 NOTE — Discharge Instructions (Signed)
Adrenocorticotropic Hormone Test WHY AM I HAVING THIS TEST? This test studies a gland in your brain called the anterior pituitary gland, and it measures how much adrenocorticotropic hormone (ACTH) that gland is producing. The adrenocorticotropic hormone test is also called an ACTH test. The ACTH test measures the level of ACTH produced by your pituitary gland in the brain. This test is often used to help diagnose conditions including Cushing syndrome and Addison disease.  You may have this test if you have symptoms of either too much or too little ACTH, such as:  Being overweight.  Having acne.  Having more hair on your body.  Being tired. The levels of ACTH vary with the time of day and are highest in the morning. These changes during the day are called diurnal variation. WHAT KIND OF SAMPLE IS TAKEN? A blood sample is required for this test. It is usually collected by inserting a needle into a vein. HOW DO I PREPARE FOR THE TEST?  Do not eat or drink anything after midnight on the night before the test or as directed by your health care provider.  You may have this test both in the morning and the evening. This checks for diurnal variation. WHAT DO THE RESULTS MEAN? Your results will be compared with a reference range of values for this test. The reference ranges for the ACTH test are as follows: Children, male or male:  1 week to 9 years: 5-46 pg/mL.  10-18 years: 6-55 pg/mL. Adults, age 28 and older:  Male: 6-58 pg/mL.  Male: 7-69 pg/mL. Reference ranges may vary among different laboratories and hospitals. Talk with your health care provider to discuss your results, treatment options, and if necessary, the need for more tests. It is your responsibility to obtain your test results. Ask the lab or department performing the test when and how you will get your results. Talk with your health care provider if you have any questions about your results.   This information is not  intended to replace advice given to you by your health care provider. Make sure you discuss any questions you have with your health care provider.   Document Released: 03/21/2004 Document Revised: 03/20/2014 Document Reviewed: 07/28/2013 Elsevier Interactive Patient Education [employer]2016 Elsevier Inc.

## 2015-03-03 NOTE — ED Provider Notes (Signed)
[facility] Emergency Department Provider Note  ____________________________________________  Time seen: Approximately 3:26 PM  I have reviewed the triage vital signs and the nursing notes.   HISTORY  Chief Complaint Fatigue    HPI Michael Shaw is a 29 y.o. male who presents with complaints of feeling fatigued and rundown. States the stomach earlier today and mainly needs a note for work. Denies any other complaints at this time. States that he doesn't feel like he needs to have any lab work done today. His past medical history is significant for hypertension and prediabetes. Currently taking amlodipine with good success.   Past Medical History  Diagnosis Date  . Prediabetes   . Hypertension     Patient Active Problem List   Diagnosis Date Noted  . Bradycardia 11/10/2013  . Elevated blood pressure 11/10/2013    History reviewed. No pertinent past surgical history.  Current Outpatient Rx  Name  Route  Sig  Dispense  Refill  . amLODipine (NORVASC) 5 MG tablet   Oral   Take 5 mg by mouth daily.         . cyclobenzaprine (FLEXERIL) 10 MG tablet   Oral   Take 1 tablet (10 mg total) by mouth every 8 (eight) hours as needed for muscle spasms.   30 tablet   1   . ibuprofen (ADVIL,MOTRIN) 800 MG tablet   Oral   Take 1 tablet (800 mg total) by mouth every 8 (eight) hours as needed.   30 tablet   0     Allergies Review of patient's allergies indicates no known allergies.  Family History  Problem Relation Age of Onset  . Asthma Brother     Social History Social History  Substance Use Topics  . Smoking status: Former Smoker -- 1.00 packs/day for 10 years    Types: Cigarettes  . Smokeless tobacco: None  . Alcohol Use: Yes     Comment: rare     Review of Systems Constitutional: No fever/chills, just generalized weakness. Eyes: No visual changes. ENT: No sore throat. Cardiovascular: Denies chest pain. Respiratory: Denies  shortness of breath. Gastrointestinal: No abdominal pain.  No nausea, no vomiting.  No diarrhea.  No constipation. Genitourinary: Negative for dysuria. Musculoskeletal: Negative for back pain. Skin: Negative for rash. Neurological: Negative for headaches, focal weakness or numbness.  10-point ROS otherwise negative.  ____________________________________________   PHYSICAL EXAM:  VITAL SIGNS: ED Triage Vitals  Enc Vitals Group     BP 03/03/15 1458 129/79 mmHg     Pulse Rate 03/03/15 1458 96     Resp 03/03/15 1458 18     Temp 03/03/15 1458 98.6 F (37 C)     Temp Source 03/03/15 1458 Oral     SpO2 03/03/15 1458 100 %     Weight 03/03/15 1458 235 lb (106.595 kg)     Height 03/03/15 1458  (1.905 m)     Head Cir --      Peak Flow --      Pain Score 03/03/15 1459 2     Pain Loc --      Pain Edu? --      Excl. in GC? --     Constitutional: Alert and oriented. Well appearing and in no acute distress. Eyes: Conjunctivae are normal. PERRL. EOMI. Head: Atraumatic. Nose: No congestion/rhinnorhea. Mouth/Throat: Mucous membranes are moist.  Oropharynx non-erythematous. Neck: No stridor.   Cardiovascular: Normal rate, regular rhythm. Grossly normal heart sounds.  Good peripheral circulation.  Respiratory: Normal respiratory effort.  No retractions. Lungs CTAB. Gastrointestinal: Soft and nontender. No distention. No abdominal bruits. No CVA tenderness. Musculoskeletal: No lower extremity tenderness nor edema.  No joint effusions. Neurologic:  Normal speech and language. No gross focal neurologic deficits are appreciated. No gait instability. Skin:  Skin is warm, dry and intact. No rash noted. Psychiatric: Mood and affect are normal. Speech and behavior are normal.  ____________________________________________   LABS (all labs ordered are listed, but only abnormal results are displayed)  Labs Reviewed - No data to  display ____________________________________________  RADIOLOGY  Deferred at this exam ____________________________________________   PROCEDURES  Procedure(s) performed: None  Critical Care performed: No  ____________________________________________   INITIAL IMPRESSION / ASSESSMENT AND PLAN / ED COURSE  Pertinent labs & imaging results that were available during my care of the patient were reviewed by me and considered in my medical decision making (see chart for details).  Unspecified fatigue. Work excuse given for today. Patient to continue amlodipine daily as directed for his blood pressure. Patient voices no other emergency medical complaints at this visit. ____________________________________________   FINAL CLINICAL IMPRESSION(S) / ED DIAGNOSES  Final diagnoses:  Other fatigue      Evangeline DakinCharles M Xoey Warmoth, [name] 03/03/15 1540  Myrna Blazeravid Matthew Schaevitz, MD 03/04/15 [phone]

## 2015-03-03 NOTE — ED Notes (Signed)
States he has been feeling weak and fatigued for the past couple of days. Denies any other sx's ..lungs clear   abd soft and non distended. Pos.bowel sounds  NAD noted at present  States stomach felt a little queasy

## 2015-03-03 NOTE — ED Notes (Addendum)
Feeling weak with some stomach pain     denies any nausea or vomiting or fever no headache  Sx's started about 2 days ago was unable to go to work needs a work note

## 2015-08-24 ENCOUNTER — Emergency Department
Admission: EM | Admit: 2015-08-24 | Discharge: 2015-08-24 | Disposition: A | Discharge status: Home / Self Care | Payer: Self-pay | Attending: Emergency Medicine | Admitting: Emergency Medicine

## 2015-08-24 ENCOUNTER — Emergency Department: Payer: Self-pay

## 2015-08-24 DIAGNOSIS — Z79899 Other long term (current) drug therapy: Secondary | ICD-10-CM | POA: Insufficient documentation

## 2015-08-24 DIAGNOSIS — R079 Chest pain, unspecified: Secondary | ICD-10-CM

## 2015-08-24 DIAGNOSIS — R0789 Other chest pain: Secondary | ICD-10-CM | POA: Insufficient documentation

## 2015-08-24 DIAGNOSIS — Z87891 Personal history of nicotine dependence: Secondary | ICD-10-CM | POA: Insufficient documentation

## 2015-08-24 DIAGNOSIS — I10 Essential (primary) hypertension: Secondary | ICD-10-CM | POA: Insufficient documentation

## 2015-08-24 LAB — BASIC METABOLIC PANEL
ANION GAP: 8 (ref 5–15)
BUN: 15 mg/dL (ref 6–20)
CALCIUM: 9.7 mg/dL (ref 8.9–10.3)
CHLORIDE: 105 mmol/L (ref 101–111)
CO2: 24 mmol/L (ref 22–32)
Creatinine, Ser: 1.06 mg/dL (ref 0.61–1.24)
GFR calc Af Amer: >60 mL/min (ref >60)
GFR calc non Af Amer: >60 mL/min (ref >60)
Glucose, Bld: 88 mg/dL (ref 65–99)
POTASSIUM: 3.8 mmol/L (ref 3.5–5.1)
Sodium: 137 mmol/L (ref 135–145)

## 2015-08-24 LAB — CBC
HEMATOCRIT: 45.6 % (ref 40.0–52.0)
HEMOGLOBIN: 14.7 g/dL (ref 13.0–18.0)
MCH: 27.7 pg (ref 26.0–34.0)
MCHC: 32.2 g/dL (ref 32.0–36.0)
MCV: 85.8 fL (ref 80.0–100.0)
Platelets: 231 10*3/uL (ref 150–440)
RBC: 5.32 MIL/uL (ref 4.40–5.90)
RDW: 13.6 % (ref 11.5–14.5)
WBC: 7.6 10*3/uL (ref 3.8–10.6)

## 2015-08-24 LAB — TROPONIN I: Troponin I: <0.03 ng/mL (ref <0.031)

## 2015-08-24 NOTE — ED Provider Notes (Signed)
[facility] Emergency Department Provider Note   ____________________________________________  Time seen: Approximately 745 PM  I have reviewed the triage vital signs and the nursing notes.   HISTORY  Chief Complaint Chest Pain   HPI Michael Shaw is a 30 y.o. male with a history of prediabetes and hypertension is present to the emergency department with 2 days of chest pain. He said that he awoke with chest pain yesterday morning that was a moderate tightness to the left side of his chest. He denies any shortness of breath. Denies any nausea, vomiting or diaphoresis. Says that the pain was constant all day. Said that he thought that it was gas and then he had some gas and then the pain reduced. He said that it feels "funny" today. However, denies any pain at this time. Denies any heavy lifting or bending. No worsening with exertion.No history of coronary artery disease. No family history of coronary artery disease. Smokes marijuana about every other day. Denies any drinking. Denies cocaine use. Says that he is feeling better at this time and denies any pain. Denies worsening of the pain or funny feeling with movement of his left arm. Denies any radiation of the pain.   Past Medical History  Diagnosis Date  . Prediabetes   . Hypertension     Patient Active Problem List   Diagnosis Date Noted  . Bradycardia 11/10/2013  . Elevated blood pressure 11/10/2013    No past surgical history on file.  Current Outpatient Rx  Name  Route  Sig  Dispense  Refill  . amLODipine (NORVASC) 5 MG tablet   Oral   Take 5 mg by mouth daily.         . cyclobenzaprine (FLEXERIL) 10 MG tablet   Oral   Take 1 tablet (10 mg total) by mouth every 8 (eight) hours as needed for muscle spasms.   30 tablet   1   . ibuprofen (ADVIL,MOTRIN) 800 MG tablet   Oral   Take 1 tablet (800 mg total) by mouth every 8 (eight) hours as needed.   30 tablet   0      Allergies Review of patient's allergies indicates no known allergies.  Family History  Problem Relation Age of Onset  . Asthma Brother     Social History Social History  Substance Use Topics  . Smoking status: Former Smoker -- 1.00 packs/day for 10 years    Types: Cigarettes  . Smokeless tobacco: Not on file  . Alcohol Use: Yes     Comment: rare     Review of Systems Constitutional: No fever/chills Eyes: No visual changes. ENT: No sore throat. Cardiovascular: As above Respiratory: Denies shortness of breath. Gastrointestinal: No abdominal pain.  No nausea, no vomiting.  No diarrhea.  No constipation. Genitourinary: Negative for dysuria. Musculoskeletal: Negative for back pain. Skin: Negative for rash. Neurological: Negative for headaches, focal weakness or numbness.  10-point ROS otherwise negative.  ____________________________________________   PHYSICAL EXAM:  VITAL SIGNS: ED Triage Vitals  Enc Vitals Group     BP 08/24/15 1925 128/67 mmHg     Pulse Rate 08/24/15 1925 70     Resp 08/24/15 1925 18     Temp 08/24/15 1925 98.6 F (37 C)     Temp Source 08/24/15 1925 Oral     SpO2 08/24/15 1925 100 %     Weight 08/24/15 1925 255 lb (115.667 kg)     Height 08/24/15 1925 6\' 3"  (1.905 m)  Head Cir --      Peak Flow --      Pain Score 08/24/15 1926 8     Pain Loc --      Pain Edu? --      Excl. in GC? --     Constitutional: Alert and oriented. Well appearing and in no acute distress. Eyes: Conjunctivae are normal. PERRL. EOMI. Head: Atraumatic. Nose: No congestion/rhinnorhea. Mouth/Throat: Mucous membranes are moist.   Neck: No stridor.   Cardiovascular: Normal rate, regular rhythm. Grossly normal heart sounds.  Good peripheral circulation With equal and bilateral radial pulses. No reproducible to palpation. Respiratory: Normal respiratory effort.  No retractions. Lungs CTAB. Gastrointestinal: Soft and nontender. No distention.  Musculoskeletal: No  lower extremity tenderness nor edema.  No joint effusions. Neurologic:  Normal speech and language. No gross focal neurologic deficits are appreciated. No gait instability. Skin:  Skin is warm, dry and intact. No rash noted. Psychiatric: Mood and affect are normal. Speech and behavior are normal.  ____________________________________________   LABS (all labs ordered are listed, but only abnormal results are displayed)  Labs Reviewed  CBC  BASIC METABOLIC PANEL  TROPONIN I   ____________________________________________  EKG  ED ECG REPORT I, Vlada Uriostegui,  Teena Irani, the attending physician, personally viewed and interpreted this ECG.   Date: 08/24/2015  EKG Time: 1931  Rate: 71  Rhythm: normal sinus rhythm  Axis: Normal  Intervals:none  ST&T Change: No ST segment elevation or depression. No abnormal T-wave inversion.  ____________________________________________  RADIOLOGY     DG Chest 2 View (Final result) Result time: 08/24/15 19:59:38   Final result by Rad Results In Interface (08/24/15 19:59:38)   Narrative:   CLINICAL DATA: Pt in with co left sided chest pain x 2 days states no hx of cardiac disease. Denies any shob or diaphoresis at this time. H/O htn.  EXAM: CHEST 2 VIEW  COMPARISON: 10/20/2013  FINDINGS: The heart size and mediastinal contours are within normal limits. Both lungs are clear. No pleural effusion or pneumothorax. The visualized skeletal structures are unremarkable.  IMPRESSION: Normal chest radiographs.   Electronically Signed By: Amie Portland M.D. On: 08/24/2015 19:59    ____________________________________________   PROCEDURES   ____________________________________________   INITIAL IMPRESSION / ASSESSMENT AND PLAN / ED COURSE  Pertinent labs & imaging results that were available during my care of the patient were reviewed by me and considered in my medical decision making (see chart for  details).  ----------------------------------------- 8:40 PM on 08/24/2015 -----------------------------------------  Patient very well appearing. PERC negative. Very low risk for cardiac disease. Heart score of 1. Discussed follow-up with his primary care doctor and the patient will be calling tomorrow morning for follow-up within 72 hours. He knows to return for any worsening or concerning symptoms. Possible gas pain causing his chest discomfort. Does not appear to be in need of admission to the hospital at this time. Will be discharged home. Patient understands plan and is willing to comply. ____________________________________________   FINAL CLINICAL IMPRESSION(S) / ED DIAGNOSES  Chest pain.    NEW MEDICATIONS STARTED DURING THIS VISIT:  New Prescriptions   No medications on file     Note:  This document was prepared using Dragon voice recognition software and may include unintentional dictation errors.    Myrna Blazer, MD 08/24/15 [phone]

## 2015-08-24 NOTE — ED Notes (Signed)
Pt in with co left sided chest pain x 2 days states no hx of cardiac disease. Denies any shob or diaphoresis at this time.

## 2015-10-04 ENCOUNTER — Emergency Department
Admission: EM | Admit: 2015-10-04 | Discharge: 2015-10-04 | Disposition: A | Discharge status: Home / Self Care | Payer: Self-pay | Attending: Emergency Medicine | Admitting: Emergency Medicine

## 2015-10-04 ENCOUNTER — Encounter: Payer: Self-pay | Admitting: Emergency Medicine

## 2015-10-04 DIAGNOSIS — Z5321 Procedure and treatment not carried out due to patient leaving prior to being seen by health care provider: Secondary | ICD-10-CM | POA: Insufficient documentation

## 2015-10-04 LAB — CBC WITH DIFFERENTIAL/PLATELET
Basophils Absolute: 0 10*3/uL (ref 0–0.1)
Basophils Relative: 1 %
Eosinophils Absolute: 0.1 10*3/uL (ref 0–0.7)
Eosinophils Relative: 2 %
HEMATOCRIT: 45.7 % (ref 40.0–52.0)
Hemoglobin: 14.8 g/dL (ref 13.0–18.0)
LYMPHS PCT: 35 %
Lymphs Abs: 2 10*3/uL (ref 1.0–3.6)
MCH: 27.3 pg (ref 26.0–34.0)
MCHC: 32.4 g/dL (ref 32.0–36.0)
MCV: 84.5 fL (ref 80.0–100.0)
MONOS PCT: 9 %
Monocytes Absolute: 0.5 10*3/uL (ref 0.2–1.0)
NEUTROS ABS: 3.1 10*3/uL (ref 1.4–6.5)
NEUTROS PCT: 53 %
Platelets: 239 10*3/uL (ref 150–440)
RBC: 5.4 MIL/uL (ref 4.40–5.90)
RDW: 14.3 % (ref 11.5–14.5)
WBC: 5.7 10*3/uL (ref 3.8–10.6)

## 2015-10-04 LAB — COMPREHENSIVE METABOLIC PANEL
ALBUMIN: 4.4 g/dL (ref 3.5–5.0)
ALT: 26 U/L (ref 17–63)
ANION GAP: 9 (ref 5–15)
AST: 30 U/L (ref 15–41)
Alkaline Phosphatase: 62 U/L (ref 38–126)
BILIRUBIN TOTAL: 0.8 mg/dL (ref 0.3–1.2)
BUN: 15 mg/dL (ref 6–20)
CALCIUM: 9.7 mg/dL (ref 8.9–10.3)
CO2: 22 mmol/L (ref 22–32)
Chloride: 106 mmol/L (ref 101–111)
Creatinine, Ser: 0.86 mg/dL (ref 0.61–1.24)
GFR calc Af Amer: >60 mL/min (ref >60)
Glucose, Bld: 110 mg/dL — ABNORMAL HIGH (ref 65–99)
Potassium: 3.8 mmol/L (ref 3.5–5.1)
Sodium: 137 mmol/L (ref 135–145)
TOTAL PROTEIN: 8.4 g/dL — AB (ref 6.5–8.1)

## 2015-10-04 LAB — TROPONIN I: Troponin I: <0.03 ng/mL (ref <0.03)

## 2015-10-04 LAB — LIPASE, BLOOD: Lipase: 48 U/L (ref 11–51)

## 2015-10-04 NOTE — ED Triage Notes (Signed)
Pt states "i drank too much water yesterday"  States he started having abd cramping and sharp "fluttering feelings" in his chest.

## 2015-10-04 NOTE — ED Notes (Signed)
Patient left while PA was interviewing him and before Dr. Sharma Covert saw the patient.

## 2015-10-04 NOTE — ED Provider Notes (Signed)
The patient left prior to M.D. Evaluation.  ED ECG REPORT I, Rockne Menghini, the attending physician, personally viewed and interpreted this ECG.   Date: 10/04/2015  EKG Time: 1541  Rate: 89  Rhythm: normal sinus rhythm  Axis: leftward  Intervals:none  ST&T Change: no STEMI    Rockne Menghini, MD 10/04/15 1739

## 2015-10-04 NOTE — ED Notes (Signed)
Pt. Eating [employer] requesting apple juice. This RN informed pt. We cannot give him anything to eat or drink prior to MD assessment and approval.

## 2015-10-04 NOTE — ED Notes (Signed)
PA student at bedside at this time 

## 2015-12-18 ENCOUNTER — Emergency Department
Admission: EM | Admit: 2015-12-18 | Discharge: 2015-12-18 | Disposition: A | Discharge status: Home / Self Care | Payer: BLUE CROSS/BLUE SHIELD | Attending: Emergency Medicine | Admitting: Emergency Medicine

## 2015-12-18 ENCOUNTER — Encounter: Payer: Self-pay | Admitting: Emergency Medicine

## 2015-12-18 DIAGNOSIS — K047 Periapical abscess without sinus: Secondary | ICD-10-CM | POA: Diagnosis not present

## 2015-12-18 DIAGNOSIS — Z87891 Personal history of nicotine dependence: Secondary | ICD-10-CM | POA: Diagnosis not present

## 2015-12-18 DIAGNOSIS — Z791 Long term (current) use of non-steroidal anti-inflammatories (NSAID): Secondary | ICD-10-CM | POA: Diagnosis not present

## 2015-12-18 DIAGNOSIS — K029 Dental caries, unspecified: Secondary | ICD-10-CM | POA: Insufficient documentation

## 2015-12-18 DIAGNOSIS — Z79899 Other long term (current) drug therapy: Secondary | ICD-10-CM | POA: Insufficient documentation

## 2015-12-18 DIAGNOSIS — I10 Essential (primary) hypertension: Secondary | ICD-10-CM | POA: Diagnosis not present

## 2015-12-18 DIAGNOSIS — J069 Acute upper respiratory infection, unspecified: Secondary | ICD-10-CM | POA: Insufficient documentation

## 2015-12-18 DIAGNOSIS — R0981 Nasal congestion: Secondary | ICD-10-CM | POA: Diagnosis present

## 2015-12-18 MED ORDER — CETIRIZINE HCL 10 MG PO TABS: 10.0000 mg | ORAL_TABLET | Freq: Every day | ORAL | 0 refills | Status: DC

## 2015-12-18 MED ORDER — MAGIC MOUTHWASH W/LIDOCAINE: 5.0000 mL | Freq: Four times a day (QID) | ORAL | 0 refills | Status: DC

## 2015-12-18 MED ORDER — FLUTICASONE PROPIONATE 50 MCG/ACT NA SUSP: 1.0000 | Freq: Two times a day (BID) | NASAL | 0 refills | Status: DC

## 2015-12-18 MED ORDER — AMOXICILLIN 875 MG PO TABS: 875.0000 mg | ORAL_TABLET | Freq: Two times a day (BID) | ORAL | 0 refills | Status: DC

## 2015-12-18 NOTE — ED Provider Notes (Signed)
[facility] Emergency Department Provider Note  ____________________________________________  Time seen: Approximately 5:21 PM  I have reviewed the triage vital signs and the nursing notes.   HISTORY  Chief Complaint Dental Pain and Sore Throat    HPI Michael Shaw is a 30 y.o. male who presents emergency department complaining of nasal congestion, sore throat, right lower jaw pain. Patient states that he started having dental pain 2 days prior. He tried to "tough it out" but states that the pain started to increase. Patient then noticed that he began to develop nasal congestion and developed a sore throat as well. Patient states that while this is aggravating and is nowhere near the dental pain. Patient reports that he has had 3 teeth that have eroded in the right lower jaw. He states the pain is in this area. No difficulty breathing or swallowing. Patient has good oral intake. Symptoms are improved with Motrin.   Past Medical History:  Diagnosis Date  . Hypertension   . Prediabetes     Patient Active Problem List   Diagnosis Date Noted  . Bradycardia 11/10/2013  . Elevated blood pressure 11/10/2013    History reviewed. No pertinent surgical history.  Prior to Admission medications   Medication Sig Start Date End Date Taking? Authorizing Provider  amLODipine (NORVASC) 5 MG tablet Take 5 mg by mouth daily.    Historical Provider, MD  amoxicillin (AMOXIL) 875 MG tablet Take 1 tablet (875 mg total) by mouth 2 (two) times daily. 12/18/15   Delorise RoyalsJonathan D Tobenna Needs, [name]  cetirizine (ZYRTEC) 10 MG tablet Take 1 tablet (10 mg total) by mouth daily. 12/18/15   Delorise RoyalsJonathan D Fin Hupp, [name]  fluticasone (FLONASE) 50 MCG/ACT nasal spray Place 1 spray into both nostrils 2 (two) times daily. 12/18/15   Delorise RoyalsJonathan D Geovanny Sartin, [name]  ibuprofen (ADVIL,MOTRIN) 800 MG tablet Take 1 tablet (800 mg total) by mouth every 8 (eight) hours as needed. 02/10/15   Evangeline Dakinharles M Beers, [name]   magic mouthwash w/lidocaine SOLN Take 5 mLs by mouth 4 (four) times daily. 12/18/15   Delorise RoyalsJonathan D Artice Bergerson, [name]    Allergies Review of patient's allergies indicates no known allergies.  Family History  Problem Relation Age of Onset  . Asthma Brother     Social History Social History  Substance Use Topics  . Smoking status: Former Smoker    Packs/day: 1.00    Years: 10.00    Types: Cigarettes  . Smokeless tobacco: Never Used  . Alcohol use Yes     Comment: rare      Review of Systems  Constitutional: No fever/chills Eyes: No visual changes. No discharge ENT: Positive for nasal congestion and sore throat. Positive for right lower dental pain Cardiovascular: no chest pain. Respiratory: no cough. No SOB. Musculoskeletal: Negative for musculoskeletal pain. Skin: Negative for rash, abrasions, lacerations, ecchymosis. Neurological: Negative for headaches, focal weakness or numbness. 10-point ROS otherwise negative.  ____________________________________________   PHYSICAL EXAM:  VITAL SIGNS: ED Triage Vitals  Enc Vitals Group     BP 12/18/15 1638 (!) 149/94     Pulse Rate 12/18/15 1638 63     Resp 12/18/15 1638 16     Temp 12/18/15 1638 97.5 F (36.4 C)     Temp Source 12/18/15 1638 Oral     SpO2 12/18/15 1638 100 %     Weight 12/18/15 1639 255 lb (115.7 kg)     Height 12/18/15 1639 6\' 3"  (1.905 m)     Head Circumference --  Peak Flow --      Pain Score 12/18/15 1639 10     Pain Loc --      Pain Edu? --      Excl. in GC? --      Constitutional: Alert and oriented. Well appearing and in no acute distress. Eyes: Conjunctivae are normal. PERRL. EOMI. Head: Atraumatic. ENT:      Ears:       Nose: Moderate congestion/rhinnorhea. Turbinates are erythematous.      Mouth/Throat: Mucous membranes are moist. Dental erosion to the first and third molar right lower dentition. Surrounding erythema and edema. No fluctuance with palpation with tongue depressor. No  drainage noted. Patient does have mild erythema in the oropharynx area. No edema. Uvula is midline. Tonsils are unremarkable bilaterally Neck: No stridor. Neck is supple. Full range of motion Hematological/Lymphatic/Immunilogical: No cervical lymphadenopathy. Cardiovascular: Normal rate, regular rhythm. Normal S1 and S2.  Good peripheral circulation. Respiratory: Normal respiratory effort without tachypnea or retractions. Lungs CTAB. Good air entry to the bases with no decreased or absent breath sounds. Musculoskeletal: Full range of motion to all extremities. No gross deformities appreciated. Neurologic:  Normal speech and language. No gross focal neurologic deficits are appreciated.  Skin:  Skin is warm, dry and intact. No rash noted. Psychiatric: Mood and affect are normal. Speech and behavior are normal. Patient exhibits appropriate insight and judgement.   ____________________________________________   LABS (all labs ordered are listed, but only abnormal results are displayed)  Labs Reviewed - No data to display ____________________________________________  EKG   ____________________________________________  RADIOLOGY   No results found.  ____________________________________________    PROCEDURES  Procedure(s) performed:    Procedures    Medications - No data to display   ____________________________________________   INITIAL IMPRESSION / ASSESSMENT AND PLAN / ED COURSE  Pertinent labs & imaging results that were available during my care of the patient were reviewed by me and considered in my medical decision making (see chart for details).  Review of the [place] CSRS was performed in accordance of the [facility] prior to dispensing any controlled drugs.  Clinical Course    Patient's diagnosis is consistent with Dental abscess and viral upper respiratory infection. Patient presents with multiple complaints. Patient has viral URI symptoms consistent with exam.  Patient also has a dental infection to the right lower dentition. No indication for abscess and no indication for incision and drainage at this time.. Patient will be discharged home with prescriptions for antibiotics for dental infection, magic mouthwash for sore throat and dental infection and Flonase and Zyrtec for nasal congestion.. Patient is to follow up with primary care and dentist as needed or otherwise directed. Patient is given ED precautions to return to the ED for any worsening or new symptoms.     ____________________________________________  FINAL CLINICAL IMPRESSION(S) / ED DIAGNOSES  Final diagnoses:  Dental abscess  Dental caries  Viral upper respiratory tract infection      NEW MEDICATIONS STARTED DURING THIS VISIT:  New Prescriptions   AMOXICILLIN (AMOXIL) 875 MG TABLET    Take 1 tablet (875 mg total) by mouth 2 (two) times daily.   CETIRIZINE (ZYRTEC) 10 MG TABLET    Take 1 tablet (10 mg total) by mouth daily.   FLUTICASONE (FLONASE) 50 MCG/ACT NASAL SPRAY    Place 1 spray into both nostrils 2 (two) times daily.   MAGIC MOUTHWASH W/LIDOCAINE SOLN    Take 5 mLs by mouth 4 (four) times daily.  This chart was dictated using voice recognition software/Dragon. Despite best efforts to proofread, errors can occur which can change the meaning. Any change was purely unintentional.    Racheal Patches, [name] 12/18/15 1735    Phineas Semen, MD 12/18/15 1827

## 2015-12-18 NOTE — ED Triage Notes (Signed)
C/O right lower jaw dental pain and sore throat.  X 1 day

## 2015-12-25 ENCOUNTER — Emergency Department
Admission: EM | Admit: 2015-12-25 | Discharge: 2015-12-25 | Disposition: A | Discharge status: Home / Self Care | Payer: BLUE CROSS/BLUE SHIELD | Attending: Emergency Medicine | Admitting: Emergency Medicine

## 2015-12-25 ENCOUNTER — Encounter: Payer: Self-pay | Admitting: Emergency Medicine

## 2015-12-25 DIAGNOSIS — R55 Syncope and collapse: Secondary | ICD-10-CM | POA: Insufficient documentation

## 2015-12-25 DIAGNOSIS — Z87891 Personal history of nicotine dependence: Secondary | ICD-10-CM | POA: Diagnosis not present

## 2015-12-25 DIAGNOSIS — I10 Essential (primary) hypertension: Secondary | ICD-10-CM | POA: Insufficient documentation

## 2015-12-25 DIAGNOSIS — Z79899 Other long term (current) drug therapy: Secondary | ICD-10-CM | POA: Diagnosis not present

## 2015-12-25 LAB — CBC WITH DIFFERENTIAL/PLATELET
Basophils Absolute: 0 K/uL (ref 0–0.1)
Basophils Relative: 1 %
Eosinophils Absolute: 0.2 K/uL (ref 0–0.7)
Eosinophils Relative: 3 %
HCT: 44.6 % (ref 40.0–52.0)
Hemoglobin: 15.1 g/dL (ref 13.0–18.0)
Lymphocytes Relative: 32 %
Lymphs Abs: 1.9 K/uL (ref 1.0–3.6)
MCH: 28.8 pg (ref 26.0–34.0)
MCHC: 33.8 g/dL (ref 32.0–36.0)
MCV: 85.1 fL (ref 80.0–100.0)
Monocytes Absolute: 0.5 K/uL (ref 0.2–1.0)
Monocytes Relative: 8 %
Neutro Abs: 3.3 K/uL (ref 1.4–6.5)
Neutrophils Relative %: 56 %
Platelets: 236 K/uL (ref 150–440)
RBC: 5.24 MIL/uL (ref 4.40–5.90)
RDW: 13.9 % (ref 11.5–14.5)
WBC: 6 K/uL (ref 3.8–10.6)

## 2015-12-25 LAB — URINALYSIS COMPLETE WITH MICROSCOPIC (ARMC ONLY)
BILIRUBIN URINE: NEGATIVE
Bacteria, UA: NONE SEEN
Glucose, UA: NEGATIVE mg/dL
Hgb urine dipstick: NEGATIVE
Leukocytes, UA: NEGATIVE
Nitrite: NEGATIVE
Protein, ur: NEGATIVE mg/dL
SQUAMOUS EPITHELIAL / LPF: NONE SEEN
Specific Gravity, Urine: 1.029 (ref 1.005–1.030)
pH: 5 (ref 5.0–8.0)

## 2015-12-25 LAB — TROPONIN I
Troponin I: <0.03 ng/mL (ref <0.03)
Troponin I: <0.03 ng/mL

## 2015-12-25 LAB — COMPREHENSIVE METABOLIC PANEL
ALK PHOS: 69 U/L (ref 38–126)
ALT: 26 U/L (ref 17–63)
AST: 28 U/L (ref 15–41)
Albumin: 4.4 g/dL (ref 3.5–5.0)
Anion gap: 7 (ref 5–15)
BILIRUBIN TOTAL: 0.5 mg/dL (ref 0.3–1.2)
BUN: 19 mg/dL (ref 6–20)
CALCIUM: 9.3 mg/dL (ref 8.9–10.3)
CO2: 25 mmol/L (ref 22–32)
CREATININE: 0.91 mg/dL (ref 0.61–1.24)
Chloride: 106 mmol/L (ref 101–111)
GFR calc Af Amer: >60 mL/min (ref >60)
GLUCOSE: 85 mg/dL (ref 65–99)
Potassium: 3.8 mmol/L (ref 3.5–5.1)
Sodium: 138 mmol/L (ref 135–145)
TOTAL PROTEIN: 8.5 g/dL — AB (ref 6.5–8.1)

## 2015-12-25 LAB — URINE DRUG SCREEN, QUALITATIVE (ARMC ONLY)
AMPHETAMINES, UR SCREEN: NOT DETECTED
BENZODIAZEPINE, UR SCRN: NOT DETECTED
Barbiturates, Ur Screen: NOT DETECTED
Cannabinoid 50 Ng, Ur ~~LOC~~: POSITIVE — AB
Cocaine Metabolite,Ur ~~LOC~~: NOT DETECTED
MDMA (ECSTASY) UR SCREEN: NOT DETECTED
METHADONE SCREEN, URINE: NOT DETECTED
OPIATE, UR SCREEN: NOT DETECTED
PHENCYCLIDINE (PCP) UR S: NOT DETECTED
Tricyclic, Ur Screen: NOT DETECTED

## 2015-12-25 NOTE — Discharge Instructions (Signed)
Be careful to eat and drink before you go to work. 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 Please be sure to follow up with your doctor later this week. Please return if you have any further symptoms.

## 2015-12-25 NOTE — ED Provider Notes (Signed)
[facility] Emergency Department Provider Note   ____________________________________________   First MD Initiated Contact with Patient 12/25/15 [phone]     (approximate)  I have reviewed the triage vital signs and the nursing notes.   HISTORY  Chief Complaint Near Syncope    HPI Michael Shaw is a 30 y.o. 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 He reports he got up at 4:00 this morning and has not had anything to eat or drink since then. He did eat supper last night.   Past Medical History:  Diagnosis Date  . Hypertension   . Prediabetes     Patient Active Problem List   Diagnosis Date Noted  . Bradycardia 11/10/2013  . Elevated blood pressure 11/10/2013    History reviewed. No pertinent surgical history.  Prior to Admission medications   Medication Sig Start Date End Date Taking? Authorizing Provider  amLODipine (NORVASC) 5 MG tablet Take 5 mg by mouth daily.    Historical Provider, MD  amoxicillin (AMOXIL) 875 MG tablet Take 1 tablet (875 mg total) by mouth 2 (two) times daily. 12/18/15   Delorise Royals Cuthriell, [name]  cetirizine (ZYRTEC) 10 MG tablet Take 1 tablet (10 mg total) by mouth daily. 12/18/15   Delorise Royals Cuthriell, [name]  fluticasone (FLONASE) 50 MCG/ACT nasal spray Place 1 spray into both nostrils 2 (two) times daily. 12/18/15   Delorise Royals Cuthriell, [name]  ibuprofen (ADVIL,MOTRIN) 800 MG tablet Take 1 tablet (800 mg total) by mouth every 8 (eight) hours as needed. 02/10/15   Evangeline Dakin, [name]  magic mouthwash w/lidocaine SOLN Take 5 mLs by mouth 4 (four) times daily. 12/18/15   Delorise Royals Cuthriell, [name]    Allergies Review of patient's allergies indicates no known allergies.  Family History  Problem Relation Age of Onset  . Asthma Brother     Social History Social History    Substance Use Topics  . Smoking status: Former Smoker    Packs/day: 1.00    Years: 10.00    Types: Cigarettes  . Smokeless tobacco: Never Used  . Alcohol use Yes     Comment: rare     Review of Systems Constitutional: No fever/chills Eyes: No visual changes. ENT: No sore throat. Cardiovascular: Denies chest pain. Respiratory: Denies shortness of breath. Gastrointestinal: No abdominal pain.  No nausea, no vomiting.  No diarrhea.  No constipation. Genitourinary: Negative for dysuria. Musculoskeletal: Negative for back pain. Skin: Negative for rash. Neurological: Negative for headaches, focal weakness or numbness.  10-point ROS otherwise negative.  ____________________________________________   PHYSICAL EXAM:  VITAL SIGNS: ED Triage Vitals [12/25/15 0843]  Enc Vitals Group     BP 127/85     Pulse Rate 71     Resp 18     Temp 97.3 F (36.3 C)     Temp Source Oral     SpO2 100 %     Weight 255 lb (115.7 kg)     Height      Head Circumference      Peak Flow      Pain Score 0     Pain Loc      Pain Edu?      Excl. in GC?     Constitutional: Alert and oriented. Well appearing and in no acute  distress. Eyes: Conjunctivae are normal. PERRL. EOMI. Head: Atraumatic. Nose: No congestion/rhinnorhea. Mouth/Throat: Mucous membranes are moist.  Oropharynx non-erythematous. Neck: No stridor. Cardiovascular: Normal rate, regular rhythm. Grossly normal heart sounds.  Good peripheral circulation. Respiratory: Normal respiratory effort.  No retractions. Lungs CTAB. Gastrointestinal: Soft and nontender. No distention. No abdominal bruits. No CVA tenderness. Musculoskeletal: No lower extremity tenderness nor edema.  No joint effusions. Neurologic:  Normal speech and language. No gross focal neurologic deficits are appreciated. No gait instability. Skin:  Skin is warm, dry and intact. No rash noted. Psychiatric: Mood and affect are normal. Speech and behavior are  normal.  ____________________________________________   LABS (all labs ordered are listed, but only abnormal results are displayed)  Labs Reviewed  COMPREHENSIVE METABOLIC PANEL - Abnormal; Notable for the following:       Result Value   Total Protein 8.5 (*)    All other components within normal limits  URINALYSIS COMPLETEWITH MICROSCOPIC ([facility] ONLY) - Abnormal; Notable for the following:    Color, Urine YELLOW (*)    APPearance CLEAR (*)    Ketones, ur TRACE (*)    All other components within normal limits  URINE DRUG SCREEN, QUALITATIVE ([facility] ONLY) - Abnormal; Notable for the following:    Cannabinoid 50 Ng, Ur [place] POSITIVE (*)    All other components within normal limits  CBC WITH DIFFERENTIAL/PLATELET  TROPONIN I  TROPONIN I   ____________________________________________  EKG  EKG #1 shows normal sinus rhythm rate of 61 normal axis there is slight ST elevation in 1 and L but it does not look pathologic. There is a flipped T in lead 3. These are all new from previous EKGs. I will watch him for a little bit EKG. EKG #2 shows normal sinus rhythm rate of 83 no change from EKG #1 the ST elevation in 1 and L is minimal and does not again look pathologic looks more like possible early repolarization. Troponins are negative patient feels well at this time. ____________________________________________  RADIOLOGY   ____________________________________________   PROCEDURES  Procedure(s) performed:  Procedures  Critical Care performed:   ____________________________________________   INITIAL IMPRESSION / ASSESSMENT AND PLAN / ED COURSE  Pertinent labs & imaging results that were available during my care of the patient were reviewed by me and considered in my medical decision making (see chart for details).    Clinical Course  On discharge patient feels well. I cautioned him about making sure he eats something and drinks. We'll have him follow-up with his  doctor.   ____________________________________________   FINAL CLINICAL IMPRESSION(S) / ED DIAGNOSES  Final diagnoses:  Near syncope      NEW MEDICATIONS STARTED DURING THIS VISIT:  New Prescriptions   No medications on file     Note:  This document was prepared using Dragon voice recognition software and may include unintentional dictation errors.    Arnaldo NatalPaul F Malinda, MD 12/25/15 [phone]

## 2015-12-25 NOTE — ED Notes (Signed)
Patient states he had an episode of "lightheadedness" at work which resolved with rest and hydration. No complaints at this time

## 2015-12-25 NOTE — ED Notes (Signed)
Report received - pt given juice

## 2015-12-25 NOTE — ED Triage Notes (Signed)
Pt to ed with c/o dizziness this am while at work.  Pt states he felt like he was going to pass out so he sat down quickly.  Pt alert and oriented at this time. Pt states he feels better now compared to at work.

## 2016-01-10 ENCOUNTER — Emergency Department
Admission: EM | Admit: 2016-01-10 | Discharge: 2016-01-10 | Disposition: A | Discharge status: Home / Self Care | Payer: BLUE CROSS/BLUE SHIELD | Attending: Emergency Medicine | Admitting: Emergency Medicine

## 2016-01-10 ENCOUNTER — Encounter: Payer: Self-pay | Admitting: Emergency Medicine

## 2016-01-10 DIAGNOSIS — I10 Essential (primary) hypertension: Secondary | ICD-10-CM | POA: Diagnosis not present

## 2016-01-10 DIAGNOSIS — Z87891 Personal history of nicotine dependence: Secondary | ICD-10-CM | POA: Diagnosis not present

## 2016-01-10 DIAGNOSIS — B029 Zoster without complications: Secondary | ICD-10-CM | POA: Diagnosis not present

## 2016-01-10 DIAGNOSIS — R21 Rash and other nonspecific skin eruption: Secondary | ICD-10-CM | POA: Diagnosis present

## 2016-01-10 DIAGNOSIS — E119 Type 2 diabetes mellitus without complications: Secondary | ICD-10-CM | POA: Insufficient documentation

## 2016-01-10 MED ORDER — VALACYCLOVIR HCL 500 MG PO TABS: 500.0000 mg | ORAL_TABLET | Freq: Three times a day (TID) | ORAL | 0 refills | Status: AC

## 2016-01-10 MED ORDER — TRAMADOL HCL 50 MG PO TABS: 50.0000 mg | ORAL_TABLET | Freq: Two times a day (BID) | ORAL | 0 refills | Status: DC | PRN

## 2016-01-10 MED ORDER — HYDROXYZINE HCL 50 MG PO TABS: 50.0000 mg | ORAL_TABLET | Freq: Three times a day (TID) | ORAL | 0 refills | Status: DC | PRN

## 2016-01-10 NOTE — ED Provider Notes (Signed)
[facility] Emergency Department Provider Note   ____________________________________________   First MD Initiated Contact with Patient 01/10/16 1121     (approximate)  I have reviewed the triage vital signs and the nursing notes.   HISTORY  Chief Complaint Rash    HPI Michael Shaw is a 30 y.o. male patient complaining of burning sensation to the left lateral chest wall 2 days ago. Patient state yesterday he broke out in a rash and increased pain. No palliative measures taken for this complaint. Patient resists pain as 8/10. Patient describes the pain as "burning with intense itching".   Past Medical History:  Diagnosis Date  . Hypertension   . Prediabetes     Patient Active Problem List   Diagnosis Date Noted  . Bradycardia 11/10/2013  . Elevated blood pressure 11/10/2013    History reviewed. No pertinent surgical history.  Prior to Admission medications   Medication Sig Start Date End Date Taking? Authorizing Provider  amLODipine (NORVASC) 5 MG tablet Take 5 mg by mouth daily.    Historical Provider, MD  amoxicillin (AMOXIL) 875 MG tablet Take 1 tablet (875 mg total) by mouth 2 (two) times daily. 12/18/15   Delorise RoyalsJonathan D Cuthriell, [name]  cetirizine (ZYRTEC) 10 MG tablet Take 1 tablet (10 mg total) by mouth daily. 12/18/15   Delorise RoyalsJonathan D Cuthriell, [name]  fluticasone (FLONASE) 50 MCG/ACT nasal spray Place 1 spray into both nostrils 2 (two) times daily. 12/18/15   Delorise RoyalsJonathan D Cuthriell, [name]  hydrOXYzine (ATARAX/VISTARIL) 50 MG tablet Take 1 tablet (50 mg total) by mouth 3 (three) times daily as needed. 01/10/16   Joni Reiningonald K Janat Tabbert, [name]  ibuprofen (ADVIL,MOTRIN) 800 MG tablet Take 1 tablet (800 mg total) by mouth every 8 (eight) hours as needed. 02/10/15   Evangeline Dakinharles M Beers, [name]  magic mouthwash w/lidocaine SOLN Take 5 mLs by mouth 4 (four) times daily. 12/18/15   Delorise RoyalsJonathan D Cuthriell, [name]  traMADol (ULTRAM) 50 MG tablet Take 1 tablet (50 mg total) by  mouth every 12 (twelve) hours as needed. 01/10/16   Joni Reiningonald K Jaleeah Slight, [name]  valACYclovir (VALTREX) 500 MG tablet Take 1 tablet (500 mg total) by mouth 3 (three) times daily. 01/10/16 01/17/16  Joni Reiningonald K Janifer Gieselman, [name]    Allergies Review of patient's allergies indicates no known allergies.  Family History  Problem Relation Age of Onset  . Asthma Brother     Social History Social History  Substance Use Topics  . Smoking status: Former Smoker    Packs/day: 1.00    Years: 10.00    Types: Cigarettes  . Smokeless tobacco: Never Used  . Alcohol use Yes     Comment: rare     Review of Systems Constitutional: No fever/chills Eyes: No visual changes. ENT: No sore throat. Cardiovascular: Denies chest pain. Respiratory: Denies shortness of breath. Gastrointestinal: No abdominal pain.  No nausea, no vomiting.  No diarrhea.  No constipation. Genitourinary: Negative for dysuria. Musculoskeletal: Negative for back pain. Skin: Positive for rash. Neurological: Negative for headaches, focal weakness or numbness. Endocrine:Hypertension. Diabetic.  ____________________________________________   PHYSICAL EXAM:  VITAL SIGNS: ED Triage Vitals  Enc Vitals Group     BP 01/10/16 1034 126/80     Pulse Rate 01/10/16 1034 68     Resp 01/10/16 1034 18     Temp 01/10/16 1034 97.9 F (36.6 C)     Temp src --      SpO2 01/10/16 1034 99 %     Weight 01/10/16  1034 255 lb (115.7 kg)     Height 01/10/16 1034 6\' 3"  (1.905 m)     Head Circumference --      Peak Flow --      Pain Score 01/10/16 1038 8     Pain Loc --      Pain Edu? --      Excl. in GC? --     Constitutional: Alert and oriented. Well appearing and in no acute distress. Eyes: Conjunctivae are normal. PERRL. EOMI. Head: Atraumatic. Nose: No congestion/rhinnorhea. Mouth/Throat: Mucous membranes are moist.  Oropharynx non-erythematous. Neck: No stridor. No cervical spine tenderness to  palpation. Hematological/Lymphatic/Immunilogical: No cervical lymphadenopathy. Cardiovascular: Normal rate, regular rhythm. Grossly normal heart sounds.  Good peripheral circulation. Respiratory: Normal respiratory effort.  No retractions. Lungs CTAB. Gastrointestinal: Soft and nontender. No distention. No abdominal bruits. No CVA tenderness. Musculoskeletal: No lower extremity tenderness nor edema.  No joint effusions. Neurologic:  Normal speech and language. No gross focal neurologic deficits are appreciated. No gait instability. Skin:  Skin is warm, dry and intact. Vesicle lesions on erythematous base left lateral chest wall. Psychiatric: Mood and affect are normal. Speech and behavior are normal.  ____________________________________________   LABS (all labs ordered are listed, but only abnormal results are displayed)  Labs Reviewed - No data to display ____________________________________________  EKG   ____________________________________________  RADIOLOGY   ____________________________________________   PROCEDURES  Procedure(s) performed: None  Procedures  Critical Care performed: No  ____________________________________________   INITIAL IMPRESSION / ASSESSMENT AND PLAN / ED COURSE  Pertinent labs & imaging results that were available during my care of the patient were reviewed by me and considered in my medical decision making (see chart for details).  Herpes zoster. Patient given discharge Instructions. Patient given a prescription of Valtrex and tramadol. Patient advised follow-up family doctor for continued care.  Clinical Course     ____________________________________________   FINAL CLINICAL IMPRESSION(S) / ED DIAGNOSES  Final diagnoses:  Herpes zoster without complication      NEW MEDICATIONS STARTED DURING THIS VISIT:  New Prescriptions   HYDROXYZINE (ATARAX/VISTARIL) 50 MG TABLET    Take 1 tablet (50 mg total) by mouth 3 (three)  times daily as needed.   TRAMADOL (ULTRAM) 50 MG TABLET    Take 1 tablet (50 mg total) by mouth every 12 (twelve) hours as needed.   VALACYCLOVIR (VALTREX) 500 MG TABLET    Take 1 tablet (500 mg total) by mouth 3 (three) times daily.     Note:  This document was prepared using Dragon voice recognition software and may include unintentional dictation errors.    Joni Reiningonald K Talib Headley, [name] 01/10/16 1131    Emily FilbertJonathan E Williams, MD 01/10/16 1420

## 2016-01-10 NOTE — ED Triage Notes (Signed)
States he developed pain to left lateral rib area on Friday then noticed rash this weekend.. Having ncreased pain today

## 2016-06-22 ENCOUNTER — Emergency Department
Admission: EM | Admit: 2016-06-22 | Discharge: 2016-06-22 | Disposition: A | Discharge status: Home / Self Care | Payer: BLUE CROSS/BLUE SHIELD | Attending: Emergency Medicine | Admitting: Emergency Medicine

## 2016-06-22 DIAGNOSIS — Y929 Unspecified place or not applicable: Secondary | ICD-10-CM | POA: Insufficient documentation

## 2016-06-22 DIAGNOSIS — Y999 Unspecified external cause status: Secondary | ICD-10-CM | POA: Insufficient documentation

## 2016-06-22 DIAGNOSIS — Z87891 Personal history of nicotine dependence: Secondary | ICD-10-CM | POA: Insufficient documentation

## 2016-06-22 DIAGNOSIS — S39012A Strain of muscle, fascia and tendon of lower back, initial encounter: Secondary | ICD-10-CM | POA: Insufficient documentation

## 2016-06-22 DIAGNOSIS — Y93F2 Activity, caregiving, lifting: Secondary | ICD-10-CM | POA: Insufficient documentation

## 2016-06-22 DIAGNOSIS — X500XXA Overexertion from strenuous movement or load, initial encounter: Secondary | ICD-10-CM | POA: Insufficient documentation

## 2016-06-22 DIAGNOSIS — I10 Essential (primary) hypertension: Secondary | ICD-10-CM | POA: Insufficient documentation

## 2016-06-22 DIAGNOSIS — Z79899 Other long term (current) drug therapy: Secondary | ICD-10-CM | POA: Insufficient documentation

## 2016-06-22 MED ORDER — CYCLOBENZAPRINE HCL 10 MG PO TABS: 10.0000 mg | ORAL_TABLET | Freq: Three times a day (TID) | ORAL | 0 refills | Status: DC | PRN

## 2016-06-22 MED ORDER — IBUPROFEN 600 MG PO TABS: 600.0000 mg | ORAL_TABLET | Freq: Three times a day (TID) | ORAL | 0 refills | Status: DC | PRN

## 2016-06-22 MED ORDER — TRAMADOL HCL 50 MG PO TABS: 50.0000 mg | ORAL_TABLET | Freq: Four times a day (QID) | ORAL | 0 refills | Status: DC | PRN

## 2016-06-22 NOTE — ED Triage Notes (Signed)
Pt reports left lower back pain that started yesterday. Pt denies N/V and pain with urination.

## 2016-06-22 NOTE — ED Notes (Signed)
See triage note presents with lower back pain   Unsure of injury but states pain started after getting out of car    States pain is non radiating  Ambulates well  Denies any urinary sx's

## 2016-06-22 NOTE — ED Provider Notes (Signed)
[facility] Emergency Department Provider Note   ____________________________________________   First MD Initiated Contact with Patient 06/22/16 [phone]     (approximate)  I have reviewed the triage vital signs and the nursing notes.   HISTORY  Chief Complaint Back Pain    HPI Michael Shaw is a 31 y.o. male patient complaining of left lower flank pain for 1 day. Patient state work requires a lot of repetitive heavy lifting yesterday was a heavy work today. Patient stated felt a pull in his back at work but did not become worse that he got home. Patient denies radicular component to his back pain. Patient denies any bladder or bowel dysfunction.Patient rates his pain as a 10 over 10. Patient described a pain as "achy/shooting/sore". Patient stated mild transient relief with ibuprofen.   Past Medical History:  Diagnosis Date  . Hypertension   . Prediabetes     Patient Active Problem List   Diagnosis Date Noted  . Bradycardia 11/10/2013  . Elevated blood pressure 11/10/2013    History reviewed. No pertinent surgical history.  Prior to Admission medications   Medication Sig Start Date End Date Taking? Authorizing Provider  amLODipine (NORVASC) 5 MG tablet Take 5 mg by mouth daily.    Historical Provider, MD  amoxicillin (AMOXIL) 875 MG tablet Take 1 tablet (875 mg total) by mouth 2 (two) times daily. 12/18/15   Delorise Royals Cuthriell, [name]  cetirizine (ZYRTEC) 10 MG tablet Take 1 tablet (10 mg total) by mouth daily. 12/18/15   Delorise Royals Cuthriell, [name]  cyclobenzaprine (FLEXERIL) 10 MG tablet Take 1 tablet (10 mg total) by mouth 3 (three) times daily as needed. 06/22/16   Joni Reining, [name]  fluticasone (FLONASE) 50 MCG/ACT nasal spray Place 1 spray into both nostrils 2 (two) times daily. 12/18/15   Delorise Royals Cuthriell, [name]  hydrOXYzine (ATARAX/VISTARIL) 50 MG tablet Take 1 tablet (50 mg total) by mouth 3 (three) times daily as needed. 01/10/16    Joni Reining, [name]  ibuprofen (ADVIL,MOTRIN) 600 MG tablet Take 1 tablet (600 mg total) by mouth every 8 (eight) hours as needed. 06/22/16   Joni Reining, [name]  ibuprofen (ADVIL,MOTRIN) 800 MG tablet Take 1 tablet (800 mg total) by mouth every 8 (eight) hours as needed. 02/10/15   Evangeline Dakin, [name]  magic mouthwash w/lidocaine SOLN Take 5 mLs by mouth 4 (four) times daily. 12/18/15   Delorise Royals Cuthriell, [name]  traMADol (ULTRAM) 50 MG tablet Take 1 tablet (50 mg total) by mouth every 12 (twelve) hours as needed. 01/10/16   Joni Reining, [name]  traMADol (ULTRAM) 50 MG tablet Take 1 tablet (50 mg total) by mouth every 6 (six) hours as needed for moderate pain. 06/22/16   Joni Reining, [name]    Allergies Patient has no known allergies.  Family History  Problem Relation Age of Onset  . Asthma Brother     Social History Social History  Substance Use Topics  . Smoking status: Former Smoker    Packs/day: 1.00    Years: 10.00    Types: Cigarettes  . Smokeless tobacco: Never Used  . Alcohol use Yes     Comment: rare     Review of Systems Constitutional: No fever/chills Eyes: No visual changes. ENT: No sore throat. Cardiovascular: Denies chest pain. Respiratory: Denies shortness of breath. Gastrointestinal: No abdominal pain.  No nausea, no vomiting.  No diarrhea.  No constipation. Genitourinary: Negative for dysuria. Musculoskeletal: Positive for  back pain. Skin: Negative for rash. Neurological: Negative for headaches, focal weakness or numbness. Endocrine: Hypertension and prediabetic. ____________________________________________   PHYSICAL EXAM:  VITAL SIGNS: ED Triage Vitals  Enc Vitals Group     BP 06/22/16 0934 132/90     Pulse Rate 06/22/16 0934 64     Resp 06/22/16 0934 18     Temp 06/22/16 0934 98.8 F (37.1 C)     Temp Source 06/22/16 0934 Oral     SpO2 06/22/16 0934 99 %     Weight 06/22/16 0934 250 lb (113.4 kg)     Height 06/22/16 0934  (1.905  m)     Head Circumference --      Peak Flow --      Pain Score 06/22/16 0937 10     Pain Loc --      Pain Edu? --      Excl. in GC? --     Constitutional: Alert and oriented. Well appearing and in no acute distress. Eyes: Conjunctivae are normal. PERRL. EOMI. Head: Atraumatic. Nose: No congestion/rhinnorhea. Mouth/Throat: Mucous membranes are moist.  Oropharynx non-erythematous. Neck: No stridor.  No cervical spine tenderness to palpation.* Hematological/Lymphatic/Immunilogical: No cervical lymphadenopathy. Cardiovascular: Normal rate, regular rhythm. Grossly normal heart sounds.  Good peripheral circulation. Respiratory: Normal respiratory effort.  No retractions. Lungs CTAB. Gastrointestinal: Soft and nontender. No distention. No abdominal bruits. No CVA tenderness. Musculoskeletal: No obvious deformity to the lumbar spine. Patient decreased range of motion with right lateral movements. (Muscle spasms with right lateral movements. Patient has negative straight leg test. Neurologic:  Normal speech and language. No gross focal neurologic deficits are appreciated. No gait instability. Skin:  Skin is warm, dry and intact. No rash noted. Psychiatric: Mood and affect are normal. Speech and behavior are normal.  ____________________________________________   LABS (all labs ordered are listed, but only abnormal results are displayed)  Labs Reviewed - No data to display ____________________________________________  EKG   ____________________________________________  RADIOLOGY   ____________________________________________   PROCEDURES  Procedure(s) performed: None  Procedures  Critical Care performed: No  ____________________________________________   INITIAL IMPRESSION / ASSESSMENT AND PLAN / ED COURSE  Pertinent labs & imaging results that were available during my care of the patient were reviewed by me and considered in my medical decision making (see chart for  details).  Lumbar strain.      ____________________________________________   FINAL CLINICAL IMPRESSION(S) / ED DIAGNOSES  Final diagnoses:  Strain of lumbar region, initial encounter  Patient given discharge Instructions. Patient given a work note. Patient given prescription for tramadol, Flexeril, ibuprofen. Patient advised follow-up family doctor if complaint persists.    NEW MEDICATIONS STARTED DURING THIS VISIT:  New Prescriptions   CYCLOBENZAPRINE (FLEXERIL) 10 MG TABLET    Take 1 tablet (10 mg total) by mouth 3 (three) times daily as needed.   IBUPROFEN (ADVIL,MOTRIN) 600 MG TABLET    Take 1 tablet (600 mg total) by mouth every 8 (eight) hours as needed.   TRAMADOL (ULTRAM) 50 MG TABLET    Take 1 tablet (50 mg total) by mouth every 6 (six) hours as needed for moderate pain.     Note:  This document was prepared using Dragon voice recognition software and may include unintentional dictation errors.    Joni Reining, [name] 06/22/16 1010    Rockne Menghini, MD 06/22/16 1534

## 2016-07-01 ENCOUNTER — Encounter: Payer: Self-pay | Admitting: Emergency Medicine

## 2016-07-01 ENCOUNTER — Emergency Department
Admission: EM | Admit: 2016-07-01 | Discharge: 2016-07-01 | Disposition: A | Discharge status: Home / Self Care | Payer: BLUE CROSS/BLUE SHIELD | Attending: Emergency Medicine | Admitting: Emergency Medicine

## 2016-07-01 DIAGNOSIS — R0789 Other chest pain: Secondary | ICD-10-CM | POA: Insufficient documentation

## 2016-07-01 DIAGNOSIS — Z79899 Other long term (current) drug therapy: Secondary | ICD-10-CM | POA: Insufficient documentation

## 2016-07-01 DIAGNOSIS — J111 Influenza due to unidentified influenza virus with other respiratory manifestations: Secondary | ICD-10-CM

## 2016-07-01 DIAGNOSIS — R509 Fever, unspecified: Secondary | ICD-10-CM | POA: Insufficient documentation

## 2016-07-01 DIAGNOSIS — Z87891 Personal history of nicotine dependence: Secondary | ICD-10-CM | POA: Insufficient documentation

## 2016-07-01 DIAGNOSIS — R69 Illness, unspecified: Secondary | ICD-10-CM

## 2016-07-01 DIAGNOSIS — R6883 Chills (without fever): Secondary | ICD-10-CM

## 2016-07-01 DIAGNOSIS — R1030 Lower abdominal pain, unspecified: Secondary | ICD-10-CM | POA: Insufficient documentation

## 2016-07-01 DIAGNOSIS — I10 Essential (primary) hypertension: Secondary | ICD-10-CM | POA: Insufficient documentation

## 2016-07-01 DIAGNOSIS — R05 Cough: Secondary | ICD-10-CM | POA: Insufficient documentation

## 2016-07-01 DIAGNOSIS — R5381 Other malaise: Secondary | ICD-10-CM | POA: Insufficient documentation

## 2016-07-01 MED ORDER — OSELTAMIVIR PHOSPHATE 75 MG PO CAPS: 75.0000 mg | ORAL_CAPSULE | Freq: Two times a day (BID) | ORAL | 0 refills | Status: AC

## 2016-07-01 NOTE — ED Triage Notes (Signed)
Pt to ed with c/o body aches, chills, fever, and abd pain intermittently x 2 days.

## 2016-07-01 NOTE — ED Provider Notes (Signed)
[name] Emergency Department Provider Note   ____________________________________________   I have reviewed the triage vital signs and the nursing notes.   HISTORY  Chief Complaint Generalized Body Aches; Chills; Fever; and Abdominal Pain    HPI Michael Shaw is a 31 y.o. male presents with body aches, malaise, chills and fever x 3 days. He did not take his temperature, reporting awaking several times in the night sweating with chills. He denies nausea, vomiting diarrhea, headache, congestion,  shortness of breath, or abdominal pain.  He denies sick contacts and did not receive the flu vaccine this season. Pt stated mild relief of body aches and fever with ibuprofen.    Past Medical History:  Diagnosis Date  . Hypertension   . Prediabetes     Patient Active Problem List   Diagnosis Date Noted  . Bradycardia 11/10/2013  . Elevated blood pressure 11/10/2013    History reviewed. No pertinent surgical history.  Prior to Admission medications   Medication Sig Start Date End Date Taking? Authorizing Provider  amLODipine (NORVASC) 5 MG tablet Take 5 mg by mouth daily.    Historical Provider, MD  amoxicillin (AMOXIL) 875 MG tablet Take 1 tablet (875 mg total) by mouth 2 (two) times daily. 12/18/15   Delorise Royals Cuthriell, [name]  cetirizine (ZYRTEC) 10 MG tablet Take 1 tablet (10 mg total) by mouth daily. 12/18/15   Delorise Royals Cuthriell, [name]  cyclobenzaprine (FLEXERIL) 10 MG tablet Take 1 tablet (10 mg total) by mouth 3 (three) times daily as needed. 06/22/16   Joni Reining, [name]  fluticasone (FLONASE) 50 MCG/ACT nasal spray Place 1 spray into both nostrils 2 (two) times daily. 12/18/15   Delorise Royals Cuthriell, [name]  hydrOXYzine (ATARAX/VISTARIL) 50 MG tablet Take 1 tablet (50 mg total) by mouth 3 (three) times daily as needed. 01/10/16   Joni Reining, [name]  ibuprofen (ADVIL,MOTRIN) 600 MG tablet Take 1 tablet (600 mg total) by mouth every 8 (eight)  hours as needed. 06/22/16   Joni Reining, [name]  ibuprofen (ADVIL,MOTRIN) 800 MG tablet Take 1 tablet (800 mg total) by mouth every 8 (eight) hours as needed. 02/10/15   Evangeline Dakin, [name]  magic mouthwash w/lidocaine SOLN Take 5 mLs by mouth 4 (four) times daily. 12/18/15   Delorise Royals Cuthriell, [name]  oseltamivir (TAMIFLU) 75 MG capsule Take 1 capsule (75 mg total) by mouth 2 (two) times daily. 07/01/16 07/06/16  Sheldon Sem M Rubyann Lingle, [name]  traMADol (ULTRAM) 50 MG tablet Take 1 tablet (50 mg total) by mouth every 12 (twelve) hours as needed. 01/10/16   Joni Reining, [name]  traMADol (ULTRAM) 50 MG tablet Take 1 tablet (50 mg total) by mouth every 6 (six) hours as needed for moderate pain. 06/22/16   Joni Reining, [name]    Allergies Patient has no known allergies.  Family History  Problem Relation Age of Onset  . Asthma Brother     Social History Social History  Substance Use Topics  . Smoking status: Former Smoker    Packs/day: 1.00    Years: 10.00    Types: Cigarettes  . Smokeless tobacco: Never Used  . Alcohol use Yes     Comment: rare     Review of Systems Constitutional: Fever/chills ENT: Denies sore throat. Cardiovascular: Chest discomfort with breathing Respiratory: Cough Gastrointestinal: Abdominal pain.  No nausea, no vomiting.   Neurological: Negative for headaches  ____________________________________________   PHYSICAL EXAM:  VITAL SIGNS: ED Triage Vitals  Enc Vitals Group     BP 07/01/16 1034 (!) 129/110     Pulse Rate 07/01/16 1034 95     Resp 07/01/16 1034 18     Temp 07/01/16 1034 99 F (37.2 C)     Temp Source 07/01/16 1034 Oral     SpO2 07/01/16 1034 100 %     Weight 07/01/16 1035 250 lb (113.4 kg)     Height --      Head Circumference --      Peak Flow --      Pain Score 07/01/16 1034 6     Pain Loc --      Pain Edu? --      Excl. in GC? --     Constitutional: Alert and oriented. Well appearing and in no acute distress. Nose: No  congestion/rhinnorhea. Neck: No cervical LAD Cardiovascular: Normal rate, regular rhythm.  Good peripheral circulation. Respiratory: Normal respiratory effort. CTA bilaterally Genitourinary: deferred Neurologic:  Normal speech and language. No gross focal neurologic deficits are appreciated.  Skin:  Skin is warm, dry and intact. No rash noted. Psychiatric: Mood and affect are normal. Speech and behavior are normal.  ____________________________________________   LABS (all labs ordered are listed, but only abnormal results are displayed)  Labs Reviewed - No data to display ____________________________________________  EKG None ____________________________________________  RADIOLOGY None ____________________________________________   PROCEDURES  Procedure(s) performed: no    Critical Care performed: no ____________________________________________   INITIAL IMPRESSION / ASSESSMENT AND PLAN / ED COURSE  Pertinent labs & imaging results that were available during my care of the patient were reviewed by me and considered in my medical decision making (see chart for details).  Pt symptoms consistent with influenza viral infection. He initially presented with tachycardia then returned with normal range during course of ED stay. Pt provided prescription for tamiflu and instructed on rest and hydration. He was instructed to return to ED if symptoms worsened otherwise follow-up with PCP as needed.      ____________________________________________   FINAL CLINICAL IMPRESSION(S) / ED DIAGNOSES  Final diagnoses:  Influenza-like illness  Chills      NEW MEDICATIONS STARTED DURING THIS VISIT:  New Prescriptions   OSELTAMIVIR (TAMIFLU) 75 MG CAPSULE    Take 1 capsule (75 mg total) by mouth 2 (two) times daily.     Note:  This document was prepared using Dragon voice recognition software and may include unintentional dictation errors.   [name], [name] 07/01/16  1237    Jene Every, MD 07/01/16 1249

## 2016-07-01 NOTE — ED Notes (Signed)
Pt discharged to home.  Family member driving.  Discharge instructions reviewed.  Verbalized understanding.  No questions or concerns at this time.  Teach back verified.  Pt in NAD.  No items left in ED.   

## 2016-07-11 ENCOUNTER — Emergency Department
Admission: EM | Admit: 2016-07-11 | Discharge: 2016-07-11 | Disposition: A | Discharge status: Home / Self Care | Payer: BLUE CROSS/BLUE SHIELD | Attending: Emergency Medicine | Admitting: Emergency Medicine

## 2016-07-11 ENCOUNTER — Encounter: Payer: Self-pay | Admitting: Emergency Medicine

## 2016-07-11 DIAGNOSIS — R05 Cough: Secondary | ICD-10-CM | POA: Insufficient documentation

## 2016-07-11 DIAGNOSIS — J111 Influenza due to unidentified influenza virus with other respiratory manifestations: Secondary | ICD-10-CM

## 2016-07-11 DIAGNOSIS — R0981 Nasal congestion: Secondary | ICD-10-CM | POA: Insufficient documentation

## 2016-07-11 DIAGNOSIS — Z791 Long term (current) use of non-steroidal anti-inflammatories (NSAID): Secondary | ICD-10-CM | POA: Insufficient documentation

## 2016-07-11 DIAGNOSIS — Z87891 Personal history of nicotine dependence: Secondary | ICD-10-CM | POA: Insufficient documentation

## 2016-07-11 DIAGNOSIS — R509 Fever, unspecified: Secondary | ICD-10-CM | POA: Insufficient documentation

## 2016-07-11 DIAGNOSIS — R69 Illness, unspecified: Secondary | ICD-10-CM

## 2016-07-11 DIAGNOSIS — Z79899 Other long term (current) drug therapy: Secondary | ICD-10-CM | POA: Insufficient documentation

## 2016-07-11 DIAGNOSIS — I10 Essential (primary) hypertension: Secondary | ICD-10-CM | POA: Insufficient documentation

## 2016-07-11 DIAGNOSIS — R52 Pain, unspecified: Secondary | ICD-10-CM | POA: Insufficient documentation

## 2016-07-11 MED ORDER — ALBUTEROL SULFATE HFA 108 (90 BASE) MCG/ACT IN AERS: 2.0000 | INHALATION_SPRAY | Freq: Four times a day (QID) | RESPIRATORY_TRACT | 0 refills | Status: DC | PRN

## 2016-07-11 MED ORDER — AZITHROMYCIN 250 MG PO TABS: ORAL_TABLET | ORAL | 0 refills | Status: DC

## 2016-07-11 MED ORDER — PREDNISONE 10 MG (21) PO TBPK: ORAL_TABLET | ORAL | 0 refills | Status: DC

## 2016-07-11 MED ORDER — GUAIFENESIN-CODEINE 100-10 MG/5ML PO SYRP: 5.0000 mL | ORAL_SOLUTION | Freq: Three times a day (TID) | ORAL | 0 refills | Status: AC | PRN

## 2016-07-11 NOTE — ED Notes (Signed)
States he was recently dx'd with the flu about 1 week ago or so   States he went back to work yesterday felt fine  But today developed headache and nausea while working   Afebrile on arrival

## 2016-07-11 NOTE — ED Triage Notes (Signed)
Pt to ed with c/o body aches, cough, congestion today.  Pt states was seen for same last week, dx with FLU,  States went back to work yesterday, felt fine but today states " I just wasn't feeling well"  Pt reports cough and congestion.

## 2016-07-11 NOTE — ED Notes (Signed)
Pt discharged home after verbalizing understanding of discharge instructions; nad noted. 

## 2016-07-11 NOTE — ED Provider Notes (Signed)
[facility] Emergency Department Provider Note  ____________________________________________  Time seen: Approximately 3:25 PM  I have reviewed the triage vital signs and the nursing notes.   HISTORY  Chief Complaint Cough; Nasal Congestion; and Generalized Body Aches    HPI Michael Shaw is a 31 y.o. male that presents to emergency department with one week of chills and non productive cough. Cough has kept him up for the last 3 nights. He coughs so hard that he starts to feel sick.He states that if he could just get this cough under control, he would feel better. He has not checked his temperature. Patient states that he was diagnosed with influenza here last week. He was having diarrhea last week but this has resolved. He started to feel better yesterday and then felt worse today at work. Patient states that he began to cough a lot and felt nauseous and weak. Patient smokes marijuana every day. He denies congestion, shortness of breath, chest pain, vomiting, diarrhea, constipation.   Past Medical History:  Diagnosis Date  . Hypertension   . Prediabetes     Patient Active Problem List   Diagnosis Date Noted  . Bradycardia 11/10/2013  . Elevated blood pressure 11/10/2013    History reviewed. No pertinent surgical history.  Prior to Admission medications   Medication Sig Start Date End Date Taking? Authorizing Provider  albuterol (PROVENTIL HFA;VENTOLIN HFA) 108 (90 Base) MCG/ACT inhaler Inhale 2 puffs into the lungs every 6 (six) hours as needed for wheezing or shortness of breath. 07/11/16   Enid Derry, [name]  amLODipine (NORVASC) 5 MG tablet Take 5 mg by mouth daily.    Historical Provider, MD  azithromycin (ZITHROMAX Z-PAK) 250 MG tablet Take 2 tablets (500 mg) on  Day 1,  followed by 1 tablet (250 mg) once daily on Days 2 through 5. 07/11/16   Enid Derry, [name]  cetirizine (ZYRTEC) 10 MG tablet Take 1 tablet (10 mg total) by mouth daily. 12/18/15    Delorise Royals Cuthriell, [name]  fluticasone (FLONASE) 50 MCG/ACT nasal spray Place 1 spray into both nostrils 2 (two) times daily. 12/18/15   Delorise Royals Cuthriell, [name]  guaiFENesin-codeine (ROBITUSSIN AC) 100-10 MG/5ML syrup Take 5 mLs by mouth 3 (three) times daily as needed for cough. 07/11/16 07/13/16  Enid Derry, [name]  hydrOXYzine (ATARAX/VISTARIL) 50 MG tablet Take 1 tablet (50 mg total) by mouth 3 (three) times daily as needed. 01/10/16   Joni Reining, [name]  ibuprofen (ADVIL,MOTRIN) 600 MG tablet Take 1 tablet (600 mg total) by mouth every 8 (eight) hours as needed. 06/22/16   Joni Reining, [name]  predniSONE (STERAPRED UNI-PAK 21 TAB) 10 MG (21) TBPK tablet Take 6 tablets on day 1, take 5 tablets on day 2, take 4 tablets on day 3, take 3 tablets on day 4, take 2 tablets on day 5, take 1 tablet on day 6 07/11/16   Enid Derry, [name]    Allergies Patient has no known allergies.  Family History  Problem Relation Age of Onset  . Asthma Brother     Social History Social History  Substance Use Topics  . Smoking status: Former Smoker    Packs/day: 1.00    Years: 10.00    Types: Cigarettes  . Smokeless tobacco: Never Used  . Alcohol use Yes     Comment: rare      Review of Systems  Constitutional: Positive fever/chills Eyes: No visual changes. No discharge. ENT: Negative for congestion and rhinorrhea. Cardiovascular:  No chest pain. Respiratory: Positive for cough. No SOB. Gastrointestinal: No abdominal pain.  No vomiting. No constipation. Musculoskeletal: Negative for musculoskeletal pain. Skin: Negative for rash, abrasions, lacerations, ecchymosis. Neurological: Negative for headaches.   ____________________________________________   PHYSICAL EXAM:  VITAL SIGNS: ED Triage Vitals  Enc Vitals Group     BP 07/11/16 1359 135/81     Pulse Rate 07/11/16 1359 (!) 104     Resp 07/11/16 1359 18     Temp 07/11/16 1359 98.2 F (36.8 C)     Temp Source 07/11/16 1359 Oral      SpO2 07/11/16 1359 100 %     Weight 07/11/16 1359 250 lb (113.4 kg)     Height 07/11/16 1359  (1.93 m)     Head Circumference --      Peak Flow --      Pain Score 07/11/16 1358 5     Pain Loc --      Pain Edu? --      Excl. in GC? --      Constitutional: Alert and oriented. Well appearing and in no acute distress. Eyes: Conjunctivae are normal. PERRL. EOMI. No discharge. Head: Atraumatic. ENT: No frontal and maxillary sinus tenderness.      Ears: Tympanic membranes pearly gray with good landmarks. No discharge.      Nose: No congestion/rhinnorhea.      Mouth/Throat: Mucous membranes are moist. Oropharynx non-erythematous. Tonsils not enlarged. No exudates. Uvula midline. Neck: No stridor.   Hematological/Lymphatic/Immunilogical: No cervical lymphadenopathy. Cardiovascular: Normal rate, regular rhythm.  Good peripheral circulation. Respiratory: Normal respiratory effort without tachypnea or retractions. Lungs CTAB. Good air entry to the bases with no decreased or absent breath sounds. Gastrointestinal: Bowel sounds 4 quadrants. Soft and nontender to palpation. No guarding or rigidity. No palpable masses. No distention. Musculoskeletal: Full range of motion to all extremities. No gross deformities appreciated. Neurologic:  Normal speech and language. No gross focal neurologic deficits are appreciated.  Skin:  Skin is warm, dry and intact. No rash noted.   ____________________________________________   LABS (all labs ordered are listed, but only abnormal results are displayed)  Labs Reviewed - No data to display ____________________________________________  EKG   ____________________________________________  RADIOLOGY   No results found.  ____________________________________________    PROCEDURES  Procedure(s) performed:    Procedures    Medications - No data to display   ____________________________________________   INITIAL IMPRESSION /  ASSESSMENT AND PLAN / ED COURSE  Pertinent labs & imaging results that were available during my care of the patient were reviewed by me and considered in my medical decision making (see chart for details).  Review of the [place] CSRS was performed in accordance of the [facility] prior to dispensing any controlled drugs.     Patient was diagnosed with influenza last week. Patient has been symptomatic for 7 days so I will cover for bacterial causes. Patient started to feel better yesterday but then felt worse today. Vital signs and exam are reassuring. Patient appears well and is staying well hydrated. Patient feels comfortable going home. Patient will be discharged home with prescriptions for azithromycin, prednisone, albuterol inhaler, and Robitussin. Patient is to follow up with PCP as needed or otherwise directed. Patient is given ED precautions to return to the ED for any worsening or new symptoms.     ____________________________________________  FINAL CLINICAL IMPRESSION(S) / ED DIAGNOSES  Final diagnoses:  Influenza-like illness      NEW MEDICATIONS STARTED DURING THIS VISIT:  Discharge Medication List as of 07/11/2016  2:53 PM    START taking these medications   Details  albuterol (PROVENTIL HFA;VENTOLIN HFA) 108 (90 Base) MCG/ACT inhaler Inhale 2 puffs into the lungs every 6 (six) hours as needed for wheezing or shortness of breath., Starting Tue 07/11/2016, Print    azithromycin (ZITHROMAX Z-PAK) 250 MG tablet Take 2 tablets (500 mg) on  Day 1,  followed by 1 tablet (250 mg) once daily on Days 2 through 5., Print    guaiFENesin-codeine (ROBITUSSIN AC) 100-10 MG/5ML syrup Take 5 mLs by mouth 3 (three) times daily as needed for cough., Starting Tue 07/11/2016, Until Thu 07/13/2016, Print    predniSONE (STERAPRED UNI-PAK 21 TAB) 10 MG (21) TBPK tablet Take 6 tablets on day 1, take 5 tablets on day 2, take 4 tablets on day 3, take 3 tablets on day 4, take 2 tablets on day 5, take 1 tablet on  day 6, Print            This chart was dictated using voice recognition software/Dragon. Despite best efforts to proofread, errors can occur which can change the meaning. Any change was purely unintentional.    Enid Derry, [name] 07/12/16 0454    Rockne Menghini, MD 07/12/16 1459

## 2016-08-03 ENCOUNTER — Encounter: Payer: Self-pay | Admitting: Emergency Medicine

## 2016-08-03 ENCOUNTER — Emergency Department
Admission: EM | Admit: 2016-08-03 | Discharge: 2016-08-03 | Disposition: A | Discharge status: Home / Self Care | Payer: Self-pay | Attending: Emergency Medicine | Admitting: Emergency Medicine

## 2016-08-03 ENCOUNTER — Emergency Department: Payer: Self-pay

## 2016-08-03 DIAGNOSIS — Z79899 Other long term (current) drug therapy: Secondary | ICD-10-CM | POA: Insufficient documentation

## 2016-08-03 DIAGNOSIS — R0789 Other chest pain: Secondary | ICD-10-CM | POA: Insufficient documentation

## 2016-08-03 DIAGNOSIS — I10 Essential (primary) hypertension: Secondary | ICD-10-CM | POA: Insufficient documentation

## 2016-08-03 DIAGNOSIS — Z87891 Personal history of nicotine dependence: Secondary | ICD-10-CM | POA: Insufficient documentation

## 2016-08-03 LAB — BASIC METABOLIC PANEL
Anion gap: 7 (ref 5–15)
BUN: 16 mg/dL (ref 6–20)
CALCIUM: 9.4 mg/dL (ref 8.9–10.3)
CO2: 26 mmol/L (ref 22–32)
CREATININE: 0.83 mg/dL (ref 0.61–1.24)
Chloride: 105 mmol/L (ref 101–111)
GFR calc Af Amer: >60 mL/min (ref >60)
GFR calc non Af Amer: >60 mL/min (ref >60)
GLUCOSE: 103 mg/dL — AB (ref 65–99)
Potassium: 3.9 mmol/L (ref 3.5–5.1)
Sodium: 138 mmol/L (ref 135–145)

## 2016-08-03 LAB — CBC
HCT: 43.4 % (ref 40.0–52.0)
Hemoglobin: 14.7 g/dL (ref 13.0–18.0)
MCH: 28.4 pg (ref 26.0–34.0)
MCHC: 33.8 g/dL (ref 32.0–36.0)
MCV: 83.8 fL (ref 80.0–100.0)
PLATELETS: 234 10*3/uL (ref 150–440)
RBC: 5.18 MIL/uL (ref 4.40–5.90)
RDW: 14.5 % (ref 11.5–14.5)
WBC: 4.9 10*3/uL (ref 3.8–10.6)

## 2016-08-03 LAB — TROPONIN I

## 2016-08-03 MED ORDER — NAPROXEN 500 MG PO TABS: 500.0000 mg | ORAL_TABLET | Freq: Two times a day (BID) | ORAL | 2 refills | Status: DC

## 2016-08-03 NOTE — ED Provider Notes (Signed)
[facility] Emergency Department Provider Note   ____________________________________________    I have reviewed the triage vital signs and the nursing notes.   HISTORY  Chief Complaint Chest Pain     HPI Michael Shaw is a 31 y.o. male who presents with complaints of right-sided chest pain which started 2 days ago. He describes the pain as aching and worse with certain ways that he rotates his chest. Sometimes it is worse when he takes a deep breath. He denies fevers or chills. Intermittent cough over the last month. No hemoptysis, no leg swelling or pain. No history of heart disease. No nausea or vomiting or diaphoresis. He has not taken anything for this. No shortness of breath. No rash   Past Medical History:  Diagnosis Date  . Hypertension   . Prediabetes     Patient Active Problem List   Diagnosis Date Noted  . Bradycardia 11/10/2013  . Elevated blood pressure 11/10/2013    History reviewed. No pertinent surgical history.  Prior to Admission medications   Medication Sig Start Date End Date Taking? Authorizing Provider  albuterol (PROVENTIL HFA;VENTOLIN HFA) 108 (90 Base) MCG/ACT inhaler Inhale 2 puffs into the lungs every 6 (six) hours as needed for wheezing or shortness of breath. 07/11/16   Enid Derry, [name]  amLODipine (NORVASC) 5 MG tablet Take 5 mg by mouth daily.    [provider]  azithromycin (ZITHROMAX Z-PAK) 250 MG tablet Take 2 tablets (500 mg) on  Day 1,  followed by 1 tablet (250 mg) once daily on Days 2 through 5. 07/11/16   Enid Derry, [name]  cetirizine (ZYRTEC) 10 MG tablet Take 1 tablet (10 mg total) by mouth daily. 12/18/15   Cuthriell, Delorise Royals, [name]  fluticasone (FLONASE) 50 MCG/ACT nasal spray Place 1 spray into both nostrils 2 (two) times daily. 12/18/15   Cuthriell, Delorise Royals, [name]  hydrOXYzine (ATARAX/VISTARIL) 50 MG tablet Take 1 tablet (50 mg total) by mouth 3 (three) times daily as needed. 01/10/16    Joni Reining, [name]  ibuprofen (ADVIL,MOTRIN) 600 MG tablet Take 1 tablet (600 mg total) by mouth every 8 (eight) hours as needed. 06/22/16   Joni Reining, [name]  naproxen (NAPROSYN) 500 MG tablet Take 1 tablet (500 mg total) by mouth 2 (two) times daily with a meal. 08/03/16   Jene Every, MD  predniSONE (STERAPRED UNI-PAK 21 TAB) 10 MG (21) TBPK tablet Take 6 tablets on day 1, take 5 tablets on day 2, take 4 tablets on day 3, take 3 tablets on day 4, take 2 tablets on day 5, take 1 tablet on day 6 07/11/16   Enid Derry, [name]     Allergies Patient has no known allergies.  Family History  Problem Relation Age of Onset  . Asthma Brother     Social History Social History  Substance Use Topics  . Smoking status: Former Smoker    Packs/day: 1.00    Years: 10.00    Types: Cigarettes  . Smokeless tobacco: Never Used  . Alcohol use Yes     Comment: rare     Review of Systems  Constitutional: No fever/chills Eyes: No visual changes.  ENT: No Neck pain Cardiovascular: As above Respiratory: Denies shortness of breath. Gastrointestinal: No abdominal pain.  Genitourinary: Negative for dysuria. Musculoskeletal: Negative for back pain. Skin: Negative for rash. Neurological: Negative for headaches   ____________________________________________   PHYSICAL EXAM:  VITAL SIGNS: ED Triage Vitals  Enc Vitals  Group     BP 08/03/16 1001 137/87     Pulse Rate 08/03/16 1001 89     Resp 08/03/16 1001 18     Temp 08/03/16 1001 98.3 F (36.8 C)     Temp Source 08/03/16 1001 Oral     SpO2 08/03/16 1001 100 %     Weight 08/03/16 0956 113.4 kg (250 lb)     Height 08/03/16 0956 1.93 m (6\' 4" )     Head Circumference --      Peak Flow --      Pain Score 08/03/16 0956 7     Pain Loc --      Pain Edu? --      Excl. in GC? --     Constitutional: Alert and oriented. No acute distress. Pleasant and interactive Eyes: Conjunctivae are normal.  Head: Atraumatic. Nose: No  congestion/rhinnorhea. Mouth/Throat: Mucous membranes are moist.   Neck:  Painless ROM Cardiovascular: Normal rate, regular rhythm. Grossly normal heart sounds.  Good peripheral circulation.Mild tenderness with palpation to the right chest wall just lateral to the nipple in between the ribs. This seems to mimic his pain. Respiratory: Normal respiratory effort.  No retractions. Lungs CTAB. Gastrointestinal: Soft and nontender. No distention.  No CVA tenderness. Genitourinary: deferred Musculoskeletal: No lower extremity tenderness nor edema.  Warm and well perfused Neurologic:  Normal speech and language. No gross focal neurologic deficits are appreciated.  Skin:  Skin is warm, dry and intact. No rash noted. Psychiatric: Mood and affect are normal. Speech and behavior are normal.  ____________________________________________   LABS (all labs ordered are listed, but only abnormal results are displayed)  Labs Reviewed  BASIC METABOLIC PANEL - Abnormal; Notable for the following:       Result Value   Glucose, Bld 103 (*)    All other components within normal limits  CBC  TROPONIN I   ____________________________________________  EKG  ED ECG REPORT I, Jene EveryKINNER, Saki Legore, the attending physician, personally viewed and interpreted this ECG.  Date: 08/03/2016  Rhythm: normal sinus rhythm QRS Axis: normal Intervals: normal ST/T Wave abnormalities: normal Conduction Disturbances: none Narrative Interpretation: unremarkable  ____________________________________________  RADIOLOGY  Chest x-ray unremarkable ____________________________________________   PROCEDURES  Procedure(s) performed: No    Critical Care performed: No ____________________________________________   INITIAL IMPRESSION / ASSESSMENT AND PLAN / ED COURSE  Pertinent labs & imaging results that were available during my care of the patient were reviewed by me and considered in my medical decision making (see  chart for details).  Patient well-appearing and in no acute distress. Reassuring exam most consistent with chest wall pain. Labs/EKG/chest x-ray are all unremarkable. I will prescribe him Naprosyn 500 mg twice a day and have him follow up with his primary care provider. We discussed return precautions to the ED    ____________________________________________   FINAL CLINICAL IMPRESSION(S) / ED DIAGNOSES  Final diagnoses:  Chest wall pain      NEW MEDICATIONS STARTED DURING THIS VISIT:  Discharge Medication List as of 08/03/2016 12:01 PM    START taking these medications   Details  naproxen (NAPROSYN) 500 MG tablet Take 1 tablet (500 mg total) by mouth 2 (two) times daily with a meal., Starting Thu 08/03/2016, Print         Note:  This document was prepared using Dragon voice recognition software and may include unintentional dictation errors.    Jene EveryKinner, Leith Szafranski, MD 08/03/16 1329

## 2016-09-04 ENCOUNTER — Encounter: Payer: Self-pay | Admitting: Emergency Medicine

## 2016-09-04 ENCOUNTER — Emergency Department
Admission: EM | Admit: 2016-09-04 | Discharge: 2016-09-04 | Disposition: A | Discharge status: Home / Self Care | Payer: BLUE CROSS/BLUE SHIELD | Attending: Emergency Medicine | Admitting: Emergency Medicine

## 2016-09-04 DIAGNOSIS — Z791 Long term (current) use of non-steroidal anti-inflammatories (NSAID): Secondary | ICD-10-CM | POA: Insufficient documentation

## 2016-09-04 DIAGNOSIS — K0889 Other specified disorders of teeth and supporting structures: Secondary | ICD-10-CM

## 2016-09-04 DIAGNOSIS — Z79899 Other long term (current) drug therapy: Secondary | ICD-10-CM | POA: Insufficient documentation

## 2016-09-04 DIAGNOSIS — Z87891 Personal history of nicotine dependence: Secondary | ICD-10-CM | POA: Insufficient documentation

## 2016-09-04 DIAGNOSIS — K047 Periapical abscess without sinus: Secondary | ICD-10-CM

## 2016-09-04 DIAGNOSIS — I10 Essential (primary) hypertension: Secondary | ICD-10-CM | POA: Insufficient documentation

## 2016-09-04 MED ORDER — LIDOCAINE-EPINEPHRINE 2 %-1:100000 IJ SOLN
1.7000 mL | Freq: Once | INTRAMUSCULAR | Status: AC
  Administered 2016-09-04: 1.7 mL via INTRADERMAL
  Filled 2016-09-04: qty 1.7

## 2016-09-04 MED ORDER — AMOXICILLIN-POT CLAVULANATE 875-125 MG PO TABS: 1.0000 | ORAL_TABLET | Freq: Two times a day (BID) | ORAL | 0 refills | Status: AC

## 2016-09-04 NOTE — ED Provider Notes (Signed)
[facility] Emergency Department Provider Note   ____________________________________________   I have reviewed the triage vital signs and the nursing notes.   HISTORY  Chief Complaint Dental Pain    HPI Michael Shaw is a 31 y.o. male presents with right upper dental pain that began a week ago after eating fish. He noted after he removed food in between the teeth then onset of severe pain. Patient has moderate dental caries and does not see a dentist regularly. Patient describes pain as a deep ache radiating up into his head and causes a headache. Patient notes difficulty sleeping secondary to the pain.   Patient denies fever, chills, headache, vision changes, chest pain, chest tightness, shortness of breath, abdominal pain, nausea and vomiting.  Past Medical History:  Diagnosis Date  . Hypertension   . Prediabetes     Patient Active Problem List   Diagnosis Date Noted  . Bradycardia 11/10/2013  . Elevated blood pressure 11/10/2013    History reviewed. No pertinent surgical history.  Prior to Admission medications   Medication Sig Start Date End Date Taking? Authorizing Provider  albuterol (PROVENTIL HFA;VENTOLIN HFA) 108 (90 Base) MCG/ACT inhaler Inhale 2 puffs into the lungs every 6 (six) hours as needed for wheezing or shortness of breath. 07/11/16   Enid DerryWagner, Ashley, [name]  amLODipine (NORVASC) 5 MG tablet Take 5 mg by mouth daily.    [provider]  amoxicillin-clavulanate (AUGMENTIN) 875-125 MG tablet Take 1 tablet by mouth 2 (two) times daily. 09/04/16 09/11/16  [place] Phifer M, [name]  azithromycin (ZITHROMAX Z-PAK) 250 MG tablet Take 2 tablets (500 mg) on  Day 1,  followed by 1 tablet (250 mg) once daily on Days 2 through 5. 07/11/16   Enid DerryWagner, Ashley, [name]  cetirizine (ZYRTEC) 10 MG tablet Take 1 tablet (10 mg total) by mouth daily. 12/18/15   Cuthriell, Delorise RoyalsJonathan D, [name]  fluticasone (FLONASE) 50 MCG/ACT nasal spray Place 1 spray into  both nostrils 2 (two) times daily. 12/18/15   Cuthriell, Delorise RoyalsJonathan D, [name]  hydrOXYzine (ATARAX/VISTARIL) 50 MG tablet Take 1 tablet (50 mg total) by mouth 3 (three) times daily as needed. 01/10/16   Joni ReiningSmith, Ronald K, [name]  ibuprofen (ADVIL,MOTRIN) 600 MG tablet Take 1 tablet (600 mg total) by mouth every 8 (eight) hours as needed. 06/22/16   Joni ReiningSmith, Ronald K, [name]  naproxen (NAPROSYN) 500 MG tablet Take 1 tablet (500 mg total) by mouth 2 (two) times daily with a meal. 08/03/16   Jene EveryKinner, Robert, MD  predniSONE (STERAPRED UNI-PAK 21 TAB) 10 MG (21) TBPK tablet Take 6 tablets on day 1, take 5 tablets on day 2, take 4 tablets on day 3, take 3 tablets on day 4, take 2 tablets on day 5, take 1 tablet on day 6 07/11/16   Enid DerryWagner, Ashley, [name]    Allergies Patient has no known allergies.  Family History  Problem Relation Age of Onset  . Asthma Brother     Social History Social History  Substance Use Topics  . Smoking status: Former Smoker    Packs/day: 1.00    Years: 10.00    Types: Cigarettes  . Smokeless tobacco: Never Used  . Alcohol use Yes     Comment: rare     Review of Systems Constitutional: Negative for fever/chills Eyes: No visual changes. ENT:  Negative for sore throat and for difficulty swallowing Cardiovascular: Denies chest pain. Respiratory: Denies cough Denies shortness of breath. Gastrointestinal: No abdominal pain.  No nausea, vomiting,  diarrhea. Genitourinary: Negative for dysuria. Musculoskeletal: Negative for back pain. Negative for generalized body aches. Skin: for rash. Neurological: Negative for headaches.  Negative focal weakness or numbness. Negative for loss of consciousness. Able to ambulate. ____________________________________________   PHYSICAL EXAM:  VITAL SIGNS: ED Triage Vitals  Enc Vitals Group     BP 09/04/16 1451 134/84     Pulse Rate 09/04/16 1451 79     Resp 09/04/16 1451 18     Temp 09/04/16 1451 98.5 F (36.9 C)     Temp Source 09/04/16 1451  Oral     SpO2 09/04/16 1451 100 %     Weight 09/04/16 1452 255 lb (115.7 kg)     Height 09/04/16 1452 6\' 3"  (1.905 m)     Head Circumference --      Peak Flow --      Pain Score 09/04/16 1456 10     Pain Loc --      Pain Edu? --      Excl. in GC? --     Constitutional: Alert and oriented. Well appearing and in no acute distress.  Head: Normocephalic and atraumatic. Eyes: Conjunctivae are normal. PERRL. Nose: No congestion/rhinorrhea Mouth/Throat: Mucous membranes are moist. Oropharynx clear non-erythematous. Right upper gum line approximately first molar tenderness to palpation with dental caries. Right lower gumline significant dental caries noted. Non-tender nonpainful. Neck: Supple. Hematological/Lymphatic/Immunological: No cervical lymphadenopathy. Cardiovascular: Normal rate, regular rhythm. Normal distal pulses. Respiratory: Normal respiratory effort.  Musculoskeletal: Nontender with normal range of motion in all extremities. Neurologic: Normal speech and language. No gross focal neurologic deficits are appreciated. Headache  Skin:  Skin is warm, dry and intact. No rash noted. Psychiatric: Mood and affect are normal.  ____________________________________________   LABS (all labs ordered are listed, but only abnormal results are displayed)  Labs Reviewed - No data to display ____________________________________________  EKG none ____________________________________________  RADIOLOGY none ____________________________________________   PROCEDURES  Procedure(s) performed:  DENTAL BLOCK  Performed by: Clois Comber Consent: Verbal consent obtained. Required items: devices and special equipment available Time out: Immediately prior to procedure a "time out" was called to verify the correct patient, procedure, equipment, support staff and site/side marked as required.  Indication: right 1st molar dental pain Nerve block body site: height of muccobuccal fold  between 1st and 2nd molar  Preparation: Patient was prepped and draped in the usual sterile fashion. Needle gauge: 27 G Location technique: anatomical landmarks  Local anesthetic: lidocaine 1%  Anesthetic total: 1.7 ml  Outcome: pain improved Patient tolerance: Patient tolerated the procedure well with no immediate complications.    Critical Care performed: no ____________________________________________   INITIAL IMPRESSION / ASSESSMENT AND PLAN / ED COURSE  Pertinent labs & imaging results that were available during my care of the patient were reviewed by me and considered in my medical decision making (see chart for details)  Patient presented with right upper gum dental pain that began approximately week ago. History, physical exam findings are consistent with dental pain associated with progressing to dental caries and likely dental infection without abscess. Patient tolerated dental nerve block without complications. Reassessment and vital signs were reassuring. Recommended patient follow-up with dental clinic for continued care. Patient was provided local dental resources in his discharge instructions. Patient was also given Augmentin for antibody coverage and advised to utilize over-the-counter NSAIDs as needed. Patient informed of clinical course, understand medical decision-making process, and agree with plan.  Patient was advised to follow up with a dental  clinic as needed and was also advised to return to the emergency department for symptoms that change or worsen.       ____________________________________________   FINAL CLINICAL IMPRESSION(S) / ED DIAGNOSES  Final diagnoses:  Pain, dental  Dental infection       NEW MEDICATIONS STARTED DURING THIS VISIT:  New Prescriptions   AMOXICILLIN-CLAVULANATE (AUGMENTIN) 875-125 MG TABLET    Take 1 tablet by mouth 2 (two) times daily.     Note:  This document was prepared using Dragon voice recognition software  and may include unintentional dictation errors.    Clois Comber, [name] 09/04/16 1615    Merrily Brittle, MD 09/05/16 1050

## 2016-09-04 NOTE — Discharge Instructions (Signed)
OPTIONS FOR DENTAL FOLLOW UP CARE ° °[place] [facility] - Local Safety Net Dental Clinics °http://www.ncdhhs.gov/dph/oralhealth/services/safetynetclinics.htm °  °[facility] (336-562-3123) ° °[facility] [place] (919-933-9087) ° °[facility] ([phone] ext [phone]) ° °[facility] ([phone]) ° °[facility] ([phone]) °This clinic caters to the indigent population and is on a lottery system. °Location: °[facility], [address], [place] °Clinic Hours: °Wednesdays from 6pm - 9pm, patients seen by a lottery system. °For dates, call or go to [email or web address].[facility].edu/[facility]/patients/Dental-[facility] °Services: °Cleanings, fillings and simple extractions. °Payment Options: °DENTAL WORK IS FREE OF CHARGE. Bring proof of income or support. °Best way to get seen: °Arrive at 5:15 pm - this is a lottery, NOT first come/first serve, so arriving earlier will not increase your chances of being seen. °  °  °[facility] °[phone] °Select option 1 for emergencies °  °Location: °[facility], [address], [place] °Clinic Hours: °No walk-ins accepted - call the day before to schedule an appointment. °Check in times are 9:30 am and 1:30 pm. °Services: °Simple extractions, temporary fillings, pulpectomy/pulp debridement, uncomplicated abscess drainage. °Payment Options: °PAYMENT IS DUE AT THE TIME OF SERVICE.  Fee is usually $100-200, additional surgical procedures (e.g. abscess drainage) may be extra. °Cash, checks, Visa/MasterCard accepted.  Can file Medicaid if patient is covered for dental - patient should call case worker to check. °No discount for [facility] patients. °Best way to get seen: °MUST call the day before and get onto the schedule. Can usually be seen the next 1-2 days. No walk-ins accepted. °  °  °[facility] °[phone] °   °Location: °[facility], [address], [place] °Clinic Hours: °M, W, Th, F 8am or 1:30pm, Tues 9a or 1:30 - first come/first served. °Services: °Simple extractions, temporary fillings, uncomplicated abscess drainage.  You do not need to be an [place] resident. °Payment Options: °PAYMENT IS DUE AT THE TIME OF SERVICE. °Dental insurance, otherwise sliding scale - bring proof of income or support. °Depending on income and treatment needed, cost is usually $50-200. °Best way to get seen: °Arrive early as it is first come/first served. °  °  °[facility] °[phone] °  °Location: °[address] °Clinic Hours: °Mon-Thu 8a-5p °Services: °Most basic dental services including extractions and fillings. °Payment Options: °PAYMENT IS DUE AT THE TIME OF SERVICE. °Sliding scale, up to 50% off - bring proof if income or support. °Medicaid with dental option accepted. °Best way to get seen: °Call to schedule an appointment, can usually be seen within 2 weeks OR they will try to see walk-ins - show up at 8a or 2p (you may have to wait). °  °  °[facility] °[phone] °[place] RESIDENTS ONLY °  °Location: °[facility], [address], [place], [place] [address] °Clinic Hours: By appointment only. °Monday - Thursday 8am-5pm, Friday 8am-12pm °Services: Cleanings, fillings, extractions. °Payment Options: °PAYMENT IS DUE AT THE TIME OF SERVICE. °Cash, Visa or MasterCard. Sliding scale - $30 minimum per service. °Best way to get seen: °Come in to office, complete packet and make an appointment - need proof of income °or support monies for each household member and proof of [place] residence. °Usually takes about a month to get in. °  °  °[facility] °[phone] °  °Location: °[address]   [place] °Clinic Hours: Walk-in Urgent Care Dental Services are offered Monday-Friday  mornings only. °The numbers of emergencies accepted daily is limited to the number of °providers available. °Maximum 15 - Mondays, Wednesdays & Thursdays °Maximum 10 - Tuesdays & Fridays °Services: °You do not need to be a [place] resident to be seen for a dental emergency. °Emergencies are defined as pain, swelling, abnormal bleeding, or dental trauma. Walkins will receive x-rays if needed. °NOTE: Dental cleaning is not an emergency. °Payment Options: °PAYMENT IS DUE AT THE TIME OF SERVICE. °Minimum co-pay is $40.00 for uninsured patients. °Minimum co-pay is $3.00 for Medicaid with dental coverage. °Dental Insurance is accepted and must be presented at time of visit. °Medicare does not cover dental. °Forms of payment: Cash, credit card, checks. °Best way to get seen: °If not previously registered with the clinic, walk-in dental registration begins at 7:15 am and is on a first come/first serve basis. °If previously registered with the clinic, call to make an appointment. °  °  °[facility] °[phone] °[place] RESIDENTS ONLY °  °Location: °[address], [place], [place] °Clinic Hours: °Mon-Thu 10a-2p °Services: Extractions only! °Payment Options: °FREE (donations accepted) - bring proof of income or support °Best way to get seen: °Call and schedule an appointment OR come at 8am on the 1st Monday of every month (except for holidays) when it is first come/first served. °  °  °[facility] °[phone] °  °Location: °[address], [place] °Clinic Hours: °Friday mornings °Services, Payment Options, Best way to get seen: °Call for info °

## 2016-09-04 NOTE — ED Triage Notes (Signed)
Developed dental pain to right upper gum line

## 2016-09-07 ENCOUNTER — Emergency Department
Admission: EM | Admit: 2016-09-07 | Discharge: 2016-09-07 | Disposition: A | Discharge status: Home / Self Care | Payer: BLUE CROSS/BLUE SHIELD | Attending: Emergency Medicine | Admitting: Emergency Medicine

## 2016-09-07 ENCOUNTER — Encounter: Payer: Self-pay | Admitting: Emergency Medicine

## 2016-09-07 DIAGNOSIS — Z79899 Other long term (current) drug therapy: Secondary | ICD-10-CM | POA: Insufficient documentation

## 2016-09-07 DIAGNOSIS — K0889 Other specified disorders of teeth and supporting structures: Secondary | ICD-10-CM | POA: Insufficient documentation

## 2016-09-07 DIAGNOSIS — I10 Essential (primary) hypertension: Secondary | ICD-10-CM | POA: Insufficient documentation

## 2016-09-07 DIAGNOSIS — Z87891 Personal history of nicotine dependence: Secondary | ICD-10-CM | POA: Insufficient documentation

## 2016-09-07 DIAGNOSIS — J45909 Unspecified asthma, uncomplicated: Secondary | ICD-10-CM | POA: Insufficient documentation

## 2016-09-07 MED ORDER — TRAMADOL HCL 50 MG PO TABS: 50.0000 mg | ORAL_TABLET | Freq: Four times a day (QID) | ORAL | 0 refills | Status: DC | PRN

## 2016-09-07 MED ORDER — IBUPROFEN 800 MG PO TABS: 800.0000 mg | ORAL_TABLET | Freq: Once | ORAL | Status: DC

## 2016-09-07 MED ORDER — IBUPROFEN 800 MG PO TABS
ORAL_TABLET | ORAL | Status: AC
  Filled 2016-09-07: qty 1

## 2016-09-07 MED ORDER — BUPIVACAINE HCL (PF) 0.5 % IJ SOLN
10.0000 mL | Freq: Once | INTRAMUSCULAR | Status: DC
  Filled 2016-09-07: qty 30

## 2016-09-07 MED ORDER — PENICILLIN V POTASSIUM 500 MG PO TABS: 500.0000 mg | ORAL_TABLET | Freq: Four times a day (QID) | ORAL | 0 refills | Status: DC

## 2016-09-07 NOTE — ED Triage Notes (Signed)
Pt c/o tooth pain to right upper molar.  Was seen here 3 days ago. Did not get abx filled because was told was 40 some dollars.  Motrin and tramadol has not worked per pt.

## 2016-09-07 NOTE — ED Notes (Signed)
Patient to Stat desk cursing, pushed through Triage door.  Attempted to speak with patient, he states "I'm in fucking pain here".  [employer] notified.

## 2016-09-07 NOTE — ED Notes (Signed)
Tobi Bastosnna RN aware of patient placement in room 12.

## 2016-09-07 NOTE — ED Provider Notes (Signed)
[facility] Emergency Department Provider Note  ____________________________________________   I have reviewed the triage vital signs and the nursing notes.   HISTORY  Chief Complaint Dental Pain   History limited by: Not Limited   HPI Michael Shaw is a 31 y.o. male who presents to the emergency department today because of concerns for continued dental pain. It is located in the right upper jaw The patient was seen in the emergency department a few days ago for the same complaint. He states he was not able to afford the antibiotics prescribed. Patient states the pain has been continuous since then. It does radiate into his temple. It is severe. It does prevent him from sleeping. He denies any fevers.   Past Medical History:  Diagnosis Date  . Hypertension   . Prediabetes     Patient Active Problem List   Diagnosis Date Noted  . Bradycardia 11/10/2013  . Elevated blood pressure 11/10/2013    History reviewed. No pertinent surgical history.  Prior to Admission medications   Medication Sig Start Date End Date Taking? Authorizing Provider  albuterol (PROVENTIL HFA;VENTOLIN HFA) 108 (90 Base) MCG/ACT inhaler Inhale 2 puffs into the lungs every 6 (six) hours as needed for wheezing or shortness of breath. 07/11/16   Enid Derry, [name]  amLODipine (NORVASC) 5 MG tablet Take 5 mg by mouth daily.    [provider]  amoxicillin-clavulanate (AUGMENTIN) 875-125 MG tablet Take 1 tablet by mouth 2 (two) times daily. 09/04/16 09/11/16  Little, Traci M, [name]  azithromycin (ZITHROMAX Z-PAK) 250 MG tablet Take 2 tablets (500 mg) on  Day 1,  followed by 1 tablet (250 mg) once daily on Days 2 through 5. 07/11/16   Enid Derry, [name]  cetirizine (ZYRTEC) 10 MG tablet Take 1 tablet (10 mg total) by mouth daily. 12/18/15   Cuthriell, Delorise Royals, [name]  fluticasone (FLONASE) 50 MCG/ACT nasal spray Place 1 spray into both nostrils 2 (two) times daily. 12/18/15    Cuthriell, Delorise Royals, [name]  hydrOXYzine (ATARAX/VISTARIL) 50 MG tablet Take 1 tablet (50 mg total) by mouth 3 (three) times daily as needed. 01/10/16   Joni Reining, [name]  ibuprofen (ADVIL,MOTRIN) 600 MG tablet Take 1 tablet (600 mg total) by mouth every 8 (eight) hours as needed. 06/22/16   Joni Reining, [name]  naproxen (NAPROSYN) 500 MG tablet Take 1 tablet (500 mg total) by mouth 2 (two) times daily with a meal. 08/03/16   Jene Every, MD  predniSONE (STERAPRED UNI-PAK 21 TAB) 10 MG (21) TBPK tablet Take 6 tablets on day 1, take 5 tablets on day 2, take 4 tablets on day 3, take 3 tablets on day 4, take 2 tablets on day 5, take 1 tablet on day 6 07/11/16   Enid Derry, [name]    Allergies Patient has no known allergies.  Family History  Problem Relation Age of Onset  . Asthma Brother     Social History Social History  Substance Use Topics  . Smoking status: Former Smoker    Packs/day: 1.00    Years: 10.00    Types: Cigarettes  . Smokeless tobacco: Never Used  . Alcohol use Yes     Comment: rare     Review of Systems Constitutional: No fever/chills Eyes: No visual changes. ENT: Positive for dental pain. Cardiovascular: Denies chest pain. Respiratory: Denies shortness of breath. Gastrointestinal: No abdominal pain.  No nausea, no vomiting.  No diarrhea.   Genitourinary: Negative for dysuria. Musculoskeletal: Negative  for back pain. Skin: Negative for rash. Neurological: Negative for headaches, focal weakness or numbness.  ____________________________________________   PHYSICAL EXAM:  VITAL SIGNS: ED Triage Vitals  Enc Vitals Group     BP 09/07/16 0831 (!) 141/86     Pulse Rate 09/07/16 0831 75     Resp 09/07/16 0831 20     Temp 09/07/16 0829 99 F (37.2 C)     Temp Source 09/07/16 0829 Oral     SpO2 09/07/16 0831 100 %     Weight 09/07/16 0830 255 lb (115.7 kg)     Height 09/07/16 0830 6\' 3"  (1.905 m)     Head Circumference --      Peak Flow --       Pain Score 09/07/16 0829 10   Constitutional: Alert and oriented. Appears uncomfortable. Eyes: Conjunctivae are normal.  ENT   Head: Normocephalic and atraumatic.   Nose: No congestion/rhinnorhea.   Mouth/Throat: Poor dentition. Slightly tender to palpation of the right upper molars.    Neck: No stridor. Hematological/Lymphatic/Immunilogical: No cervical lymphadenopathy. Cardiovascular: Normal rate, regular rhythm.  No murmurs, rubs, or gallops.  Respiratory: Normal respiratory effort without tachypnea nor retractions. Breath sounds are clear and equal bilaterally. No wheezes/rales/rhonchi. Gastrointestinal: Soft and non tender. No rebound. No guarding.  Genitourinary: Deferred Musculoskeletal: Normal range of motion in all extremities. Neurologic:  Normal speech and language. No gross focal neurologic deficits are appreciated.  Skin:  Skin is warm, dry and intact. No rash noted. Psychiatric: Mood and affect are normal. Speech and behavior are normal. Patient exhibits appropriate insight and judgment.  ____________________________________________    LABS (pertinent positives/negatives)  None  ____________________________________________   EKG  None  ____________________________________________    RADIOLOGY  None   ____________________________________________   PROCEDURES  Procedures  NERVE BLOCK Performed by: Phineas SemenGOODMAN, Adaleah Forget Consent: Verbal consent obtained. Required items: required blood products, implants, devices, and special equipment available Time out: Immediately prior to procedure a "time out" was called to verify the correct patient, procedure, equipment, support staff and site/side marked as required.  Indication: Dental pain Nerve block body site: right upper molar  Needle gauge: 25 G Location technique: anatomical landmarks  Local anesthetic: 0.5% bupivicaine  Anesthetic total: 2  ml  ____________________________________________   INITIAL IMPRESSION / ASSESSMENT AND PLAN / ED COURSE  Pertinent labs & imaging results that were available during my care of the patient were reviewed by me and considered in my medical decision making (see chart for details).  Patient presented to the emergency department today because of concerns for continued dental pain. Patient did undergo nerve block here. We will discharge home with prescription for itching for antibiotics as well as tramadol. [employer] [place] drug [place] was checked.  ____________________________________________   FINAL CLINICAL IMPRESSION(S) / ED DIAGNOSES  Final diagnoses:  Pain, dental     Note: This dictation was prepared with Dragon dictation. Any transcriptional errors that result from this process are unintentional     Phineas SemenGoodman, Caidin Heidenreich, MD 09/07/16 1406

## 2016-09-07 NOTE — Discharge Instructions (Signed)
Please seek medical attention for any high fevers, chest pain, shortness of breath, change in behavior, persistent vomiting, bloody stool or any other new or concerning symptoms.  

## 2016-09-07 NOTE — ED Notes (Signed)
Pt walked back through triage from lobby to flex wait cussing and yelling.  "fuck this having to wait".  Called charge to have [employer] officer come to front.

## 2016-09-07 NOTE — ED Notes (Signed)
Patient to room 12 with [employer] officer Amy.

## 2016-09-07 NOTE — ED Notes (Signed)
Pt still cussing and screaming. [employer] officer with pt.  Informed other patients ahead of him and has to wait his turn and that everyone is here because they are hurting.

## 2016-09-07 NOTE — ED Notes (Signed)
Patient's significant other reports that patient was seen in the ED a couple of days ago and was given a prescription for an anti-biotic that cost $40 when [facility] went [facility] the [facility].  Patient was then unable to fill the prescription and has been in severe pain since that time and had difficulty sleeping.  Patient quietly holding his jaw and appears uncomfortable.

## 2016-10-11 ENCOUNTER — Encounter: Payer: Self-pay | Admitting: *Deleted

## 2016-10-11 ENCOUNTER — Emergency Department
Admission: EM | Admit: 2016-10-11 | Discharge: 2016-10-11 | Disposition: A | Discharge status: Home / Self Care | Payer: BLUE CROSS/BLUE SHIELD | Attending: Emergency Medicine | Admitting: Emergency Medicine

## 2016-10-11 DIAGNOSIS — Z79899 Other long term (current) drug therapy: Secondary | ICD-10-CM | POA: Insufficient documentation

## 2016-10-11 DIAGNOSIS — I10 Essential (primary) hypertension: Secondary | ICD-10-CM | POA: Insufficient documentation

## 2016-10-11 DIAGNOSIS — Z87891 Personal history of nicotine dependence: Secondary | ICD-10-CM | POA: Insufficient documentation

## 2016-10-11 DIAGNOSIS — L03011 Cellulitis of right finger: Secondary | ICD-10-CM

## 2016-10-11 MED ORDER — BACITRACIN ZINC 500 UNIT/GM EX OINT: TOPICAL_OINTMENT | Freq: Two times a day (BID) | CUTANEOUS | Status: DC

## 2016-10-11 MED ORDER — BACITRACIN ZINC 500 UNIT/GM EX OINT
TOPICAL_OINTMENT | CUTANEOUS | Status: AC
  Administered 2016-10-11: 19:00:00
  Filled 2016-10-11: qty 0.9

## 2016-10-11 NOTE — Discharge Instructions (Signed)
Keep finger covered with a Band-Aid. Apply Neosporin or bacitracin to the area daily prior to putting the Band-Aid on.  Once or twice a day use the chlorhexidine soap with warm water to soak the finger.  If you notice symptoms not improving or worsening return to the emergency department otherwise follow-up with her primary care as needed.

## 2016-10-11 NOTE — ED Notes (Signed)
See triage note  States he developed pain and swelling to right middle finger w/o injury several days ago

## 2016-10-11 NOTE — ED Provider Notes (Signed)
[facility] Emergency Department Provider Note   ____________________________________________   I have reviewed the triage vital signs and the nursing notes.   HISTORY  Chief Complaint Hand Pain    HPI Michael Shaw is a 31 y.o. male presents to the emergency department with right middle digit nail bed swelling that developed 4 days ago. Patient reports he does bite his nails however he has never had any problems or infections around the fingertips. Patient noted around the cuticle increasing redness, swelling and tenderness to touch. Patient denies noting any drainage from the affected area. Patient denies any recent trauma to the finger and is unsure if he has been bitten by an insect. Patient denies fever, chills, headache, vision changes, chest pain, chest tightness, shortness of breath, abdominal pain, nausea and vomiting.  Past Medical History:  Diagnosis Date  . Hypertension   . Prediabetes     Patient Active Problem List   Diagnosis Date Noted  . Bradycardia 11/10/2013  . Elevated blood pressure 11/10/2013    History reviewed. No pertinent surgical history.  Prior to Admission medications   Medication Sig Start Date End Date Taking? Authorizing Provider  amLODipine (NORVASC) 5 MG tablet Take 5 mg by mouth daily.    [provider]  cetirizine (ZYRTEC) 10 MG tablet Take 1 tablet (10 mg total) by mouth daily. Patient not taking: Reported on 09/07/2016 12/18/15   Cuthriell, Delorise RoyalsJonathan D, [name]  fluticasone (FLONASE) 50 MCG/ACT nasal spray Place 1 spray into both nostrils 2 (two) times daily. Patient not taking: Reported on 09/07/2016 12/18/15   Cuthriell, Delorise RoyalsJonathan D, [name]  hydrOXYzine (ATARAX/VISTARIL) 50 MG tablet Take 1 tablet (50 mg total) by mouth 3 (three) times daily as needed. Patient not taking: Reported on 09/07/2016 01/10/16   Joni ReiningSmith, Ronald K, [name]  ibuprofen (ADVIL,MOTRIN) 600 MG tablet Take 1 tablet (600 mg total) by mouth every  8 (eight) hours as needed. Patient not taking: Reported on 09/07/2016 06/22/16   Joni ReiningSmith, Ronald K, [name]  naproxen (NAPROSYN) 500 MG tablet Take 1 tablet (500 mg total) by mouth 2 (two) times daily with a meal. Patient not taking: Reported on 09/07/2016 08/03/16   Jene EveryKinner, Robert, MD  penicillin v potassium (VEETID) 500 MG tablet Take 1 tablet (500 mg total) by mouth 4 (four) times daily. 09/07/16   Phineas SemenGoodman, Graydon, MD  predniSONE (STERAPRED UNI-PAK 21 TAB) 10 MG (21) TBPK tablet Take 6 tablets on day 1, take 5 tablets on day 2, take 4 tablets on day 3, take 3 tablets on day 4, take 2 tablets on day 5, take 1 tablet on day 6 Patient not taking: Reported on 09/07/2016 07/11/16   Enid DerryWagner, Ashley, [name]  traMADol (ULTRAM) 50 MG tablet Take 1 tablet (50 mg total) by mouth every 6 (six) hours as needed. 09/07/16 09/07/17  Phineas SemenGoodman, Graydon, MD    Allergies Patient has no known allergies.  Family History  Problem Relation Age of Onset  . Asthma Brother     Social History Social History  Substance Use Topics  . Smoking status: Former Smoker    Packs/day: 1.00    Years: 10.00    Types: Cigarettes  . Smokeless tobacco: Never Used  . Alcohol use Yes     Comment: rare     Review of Systems Constitutional: Negative for fever/chills Eyes: No visual changes. ENT:  Negative for sore throat and for difficulty swallowing Cardiovascular: Denies chest pain. Respiratory: Denies cough. Denies shortness of breath. Gastrointestinal: No abdominal  pain.  No nausea, vomiting, diarrhea. Genitourinary: Negative for dysuria. Musculoskeletal: Negative for back pain. Skin: Negative for rash. Right middle digit cuticle area with swelling, erythema and area of fluctuant. Neurological: Negative for headaches.  Negative focal weakness or numbness. Negative for loss of consciousness. Able to ambulate. ____________________________________________   PHYSICAL EXAM:  VITAL SIGNS: ED Triage Vitals  Enc Vitals Group     BP  10/11/16 1638 133/87     Pulse Rate 10/11/16 1638 89     Resp 10/11/16 1638 16     Temp 10/11/16 1638 98.5 F (36.9 C)     Temp Source 10/11/16 1638 Oral     SpO2 10/11/16 1638 98 %     Weight 10/11/16 1638 273 lb (123.8 kg)     Height 10/11/16 1638 6\' 4"  (1.93 m)     Head Circumference --      Peak Flow --      Pain Score 10/11/16 1656 7     Pain Loc --      Pain Edu? --      Excl. in GC? --     Constitutional: Alert and oriented. Well appearing and in no acute distress.   Eyes: Conjunctivae are normal. PERRL. Head: Normocephalic and atraumatic. ENT:      Ears: Canals clear. TMs intact bilaterally.      Nose: No congestion/rhinnorhea.      Mouth/Throat: Mucous membranes are moist. Neck:Supple. No thyromegaly. No stridor.  Cardiovascular: Normal rate, regular rhythm. Normal S1 and S2.  Good peripheral circulation. Respiratory: Normal respiratory effort without tachypnea or retractions. Lungs CTAB. Good air entry to the bases with no decreased or absent breath sounds. Hematological/Lymphatic/Immunological: No cervical lymphadenopathy. Cardiovascular: Normal rate, regular rhythm. Normal distal pulses. Respiratory: Normal respiratory effort. No wheezes/rales/rhonchi. Lungs CTAB with no W/R/R. Gastrointestinal: Bowel sounds 4 quadrants. Soft and nontender to palpation. No guarding or rigidity. No palpable masses. No distention. No CVA tenderness. Musculoskeletal: Nontender with normal range of motion in all extremities. Neurologic: Normal speech and language.  Skin:  Skin is warm, dry and intact. No rash noted. Right middle digit paronychia with erythema, induration and fluctuance.  Psychiatric: Mood and affect are normal. Speech and behavior are normal. Patient exhibits appropriate insight and judgement.  ____________________________________________   LABS (all labs ordered are listed, but only abnormal results are displayed)  Labs Reviewed - No data to  display ____________________________________________  EKG none ____________________________________________  RADIOLOGY none ____________________________________________   PROCEDURES  Procedure(s) performed:  Right middle digit paronychia- Lance and drain Right middle digit prepped with betadine and area of fluctuance lanced with 18 ga needle. Drainage purulent, approximately 2 ml.   Finger irrigated then Bacitracin applied and Band-Aid applied.   No complications noted.     Critical Care performed: no ____________________________________________   INITIAL IMPRESSION / ASSESSMENT AND PLAN / ED COURSE  Pertinent labs & imaging results that were available during my care of the patient were reviewed by me and considered in my medical decision making (see chart for details).  Patient presented with paronychia of the right middle digit. The paronychia lanced and drained without complication. Patient advised to soak finger two times a day with chlorhexidine and warm water in addition to applying neosporin and covering with a band-aid through-out the day. . Patient instructed to keep wound clean, dry and covered during his work day. Patient informed of clinical course, understand medical decision-making process, and agree with plan.  Patient was advised to follow up with PCP  and was also advised to return to the emergency department for symptoms that change or worsen.      ____________________________________________   FINAL CLINICAL IMPRESSION(S) / ED DIAGNOSES  Final diagnoses:  Paronychia of finger of right hand       NEW MEDICATIONS STARTED DURING THIS VISIT:  Discharge Medication List as of 10/11/2016  6:24 PM       Note:  This document was prepared using Dragon voice recognition software and may include unintentional dictation errors.    Clois ComberLittle, Melanye Hiraldo M, [name] 10/11/16 1947    Sharyn CreamerQuale, Mark, MD 10/12/16 [id number]

## 2016-10-11 NOTE — ED Triage Notes (Signed)
Pt has pain and swelling to right 3rd finger around nail bed.  Pt bites fingernails.  Denies injury to finger   Sx for 4 days.   No drainage.

## 2016-10-20 ENCOUNTER — Emergency Department: Payer: BLUE CROSS/BLUE SHIELD

## 2016-10-20 ENCOUNTER — Encounter: Payer: Self-pay | Admitting: Emergency Medicine

## 2016-10-20 ENCOUNTER — Emergency Department
Admission: EM | Admit: 2016-10-20 | Discharge: 2016-10-20 | Disposition: A | Discharge status: Home / Self Care | Payer: BLUE CROSS/BLUE SHIELD | Attending: Emergency Medicine | Admitting: Emergency Medicine

## 2016-10-20 DIAGNOSIS — R079 Chest pain, unspecified: Secondary | ICD-10-CM | POA: Insufficient documentation

## 2016-10-20 DIAGNOSIS — Z79899 Other long term (current) drug therapy: Secondary | ICD-10-CM | POA: Insufficient documentation

## 2016-10-20 DIAGNOSIS — I10 Essential (primary) hypertension: Secondary | ICD-10-CM | POA: Insufficient documentation

## 2016-10-20 DIAGNOSIS — Z87891 Personal history of nicotine dependence: Secondary | ICD-10-CM | POA: Insufficient documentation

## 2016-10-20 LAB — BASIC METABOLIC PANEL
ANION GAP: 6 (ref 5–15)
BUN: 12 mg/dL (ref 6–20)
CO2: 27 mmol/L (ref 22–32)
Calcium: 10.1 mg/dL (ref 8.9–10.3)
Chloride: 107 mmol/L (ref 101–111)
Creatinine, Ser: 1.04 mg/dL (ref 0.61–1.24)
GLUCOSE: 99 mg/dL (ref 65–99)
POTASSIUM: 4.2 mmol/L (ref 3.5–5.1)
Sodium: 140 mmol/L (ref 135–145)

## 2016-10-20 LAB — CBC
HEMATOCRIT: 44.8 % (ref 40.0–52.0)
HEMOGLOBIN: 14.7 g/dL (ref 13.0–18.0)
MCH: 27.8 pg (ref 26.0–34.0)
MCHC: 32.9 g/dL (ref 32.0–36.0)
MCV: 84.5 fL (ref 80.0–100.0)
Platelets: 250 10*3/uL (ref 150–440)
RBC: 5.31 MIL/uL (ref 4.40–5.90)
RDW: 14.4 % (ref 11.5–14.5)
WBC: 6 10*3/uL (ref 3.8–10.6)

## 2016-10-20 LAB — TROPONIN I

## 2016-10-20 NOTE — ED Notes (Signed)
AAOx3.  Skin warm and dry.  NAD 

## 2016-10-20 NOTE — ED Notes (Signed)
Signature pad not working, pt verbalizes understanding of discharge and follow up instructions 

## 2016-10-20 NOTE — ED Notes (Signed)
ED Provider at bedside. 

## 2016-10-20 NOTE — ED Triage Notes (Signed)
C/O left and mid chest pain.  States pain worse with deep breathing or moving left arm.  Describes pain as sharp.

## 2016-10-20 NOTE — ED Provider Notes (Signed)
[facility] Emergency Department Provider Note  ____________________________________________   First MD Initiated Contact with Patient 10/20/16 2034     (approximate)  I have reviewed the triage vital signs and the nursing notes.   HISTORY  Chief Complaint Chest Pain   HPI Michael Shaw is a 31 y.o. male with a history of hypertension was presenting to the emergency department with left-sided chest pain that started this morning at 11 AM. He says that he was bending forward in a chair at the barbershop when he had a sharp pain to the left side of his chest just under the breast. He says that the pain then went away. It was not associated with any shortness of breath. However, throughout the day with movement of the left upper extremity, intermittently with deep breaths and with sitting forward he has had return of this pain only for a second or 2 at a time, sharp and then going away. He denies any diaphoresis, nausea vomiting. Denies any long trips lately. Denies any history of blood clots in his family. Denies any history of cardiac disease in his family but does admit to hypertension in his family. He is pain-free at this time. Denies any worsening of the pain with deep breathing at this time.   Past Medical History:  Diagnosis Date  . Hypertension   . Prediabetes     Patient Active Problem List   Diagnosis Date Noted  . Bradycardia 11/10/2013  . Elevated blood pressure 11/10/2013    History reviewed. No pertinent surgical history.  Prior to Admission medications   Medication Sig Start Date End Date Taking? Authorizing Provider  amLODipine (NORVASC) 5 MG tablet Take 5 mg by mouth daily.    [provider]  cetirizine (ZYRTEC) 10 MG tablet Take 1 tablet (10 mg total) by mouth daily. Patient not taking: Reported on 09/07/2016 12/18/15   Cuthriell, Delorise Royals, [name]  fluticasone (FLONASE) 50 MCG/ACT nasal spray Place 1 spray into both nostrils  2 (two) times daily. Patient not taking: Reported on 09/07/2016 12/18/15   Cuthriell, Delorise Royals, [name]  hydrOXYzine (ATARAX/VISTARIL) 50 MG tablet Take 1 tablet (50 mg total) by mouth 3 (three) times daily as needed. Patient not taking: Reported on 09/07/2016 01/10/16   Joni Reining, [name]  ibuprofen (ADVIL,MOTRIN) 600 MG tablet Take 1 tablet (600 mg total) by mouth every 8 (eight) hours as needed. Patient not taking: Reported on 09/07/2016 06/22/16   Joni Reining, [name]  naproxen (NAPROSYN) 500 MG tablet Take 1 tablet (500 mg total) by mouth 2 (two) times daily with a meal. Patient not taking: Reported on 09/07/2016 08/03/16   Jene Every, MD  penicillin v potassium (VEETID) 500 MG tablet Take 1 tablet (500 mg total) by mouth 4 (four) times daily. 09/07/16   Phineas Semen, MD  predniSONE (STERAPRED UNI-PAK 21 TAB) 10 MG (21) TBPK tablet Take 6 tablets on day 1, take 5 tablets on day 2, take 4 tablets on day 3, take 3 tablets on day 4, take 2 tablets on day 5, take 1 tablet on day 6 Patient not taking: Reported on 09/07/2016 07/11/16   Enid Derry, [name]  traMADol (ULTRAM) 50 MG tablet Take 1 tablet (50 mg total) by mouth every 6 (six) hours as needed. 09/07/16 09/07/17  Phineas Semen, MD    Allergies Patient has no known allergies.  Family History  Problem Relation Age of Onset  . Asthma Brother     Social History Social  History  Substance Use Topics  . Smoking status: Former Smoker    Packs/day: 1.00    Years: 10.00    Types: Cigarettes  . Smokeless tobacco: Never Used  . Alcohol use Yes     Comment: rare     Review of Systems  Constitutional: No fever/chills Eyes: No visual changes. ENT: No sore throat. Cardiovascular: as above Respiratory: Denies shortness of breath. Gastrointestinal: No abdominal pain.  No nausea, no vomiting.  No diarrhea.  No constipation. Genitourinary: Negative for dysuria. Musculoskeletal: Negative for back pain. Skin: Negative for  rash. Neurological: Negative for headaches, focal weakness or numbness.   ____________________________________________   PHYSICAL EXAM:  VITAL SIGNS: ED Triage Vitals  Enc Vitals Group     BP 10/20/16 1809 (!) 133/95     Pulse Rate 10/20/16 1809 82     Resp 10/20/16 1809 16     Temp 10/20/16 1809 97.9 F (36.6 C)     Temp Source 10/20/16 1809 Oral     SpO2 10/20/16 1809 98 %     Weight 10/20/16 1808 273 lb (123.8 kg)     Height 10/20/16 1808 6\' 4"  (1.93 m)     Head Circumference --      Peak Flow --      Pain Score 10/20/16 1808 10     Pain Loc --      Pain Edu? --      Excl. in GC? --     Constitutional: Alert and oriented. Well appearing and in no acute distress. Eyes: Conjunctivae are normal.  Head: Atraumatic. Nose: No congestion/rhinnorhea. Mouth/Throat: Mucous membranes are moist.  Neck: No stridor.   Cardiovascular: Normal rate, regular rhythm. Grossly normal heart sounds.  Good peripheral circulation With equal bilateral radial pulses. Chest pain is not reproducible palpation. Respiratory: Normal respiratory effort.  No retractions. Lungs CTAB. Gastrointestinal: Soft and nontender. No distention.  Musculoskeletal: No lower extremity tenderness nor edema.  No joint effusions. Neurologic:  Normal speech and language. No gross focal neurologic deficits are appreciated. Skin:  Skin is warm, dry and intact. No rash noted. Psychiatric: Mood and affect are normal. Speech and behavior are normal.  ____________________________________________   LABS (all labs ordered are listed, but only abnormal results are displayed)  Labs Reviewed  BASIC METABOLIC PANEL  CBC  TROPONIN I   ____________________________________________  EKG  ED ECG REPORT I, Deyvi Bonanno,  Teena Iraniavid M, the attending physician, personally viewed and interpreted this ECG.   Date: 10/20/2016  EKG Time: 1806  Rate: 84  Rhythm: normal sinus rhythm  Axis: Normal  Intervals:none  ST&T Change: No ST  segment elevation or depression. No abnormal T-wave inversion.  ____________________________________________  RADIOLOGY  No acute disease on the chest x-ray ____________________________________________   PROCEDURES  Procedure(s) performed:   Procedures  Critical Care performed:   ____________________________________________   INITIAL IMPRESSION / ASSESSMENT AND PLAN / ED COURSE  Pertinent labs & imaging results that were available during my care of the patient were reviewed by me and considered in my medical decision making (see chart for details).  PERC negative.  Patient continues to be chest pain-free and even his chest pain-free at this time when he is sitting up. Very atypical story for PE or ACS. Likely musculoskeletal pain. Patient will follow up with primary care. We discussed using ibuprofen as well as icy hot or Aspercreme to the area. The patient is to return to the hospital for an worsening or concerning symptoms. He is understanding the  plan and willing to comply.      ____________________________________________   FINAL CLINICAL IMPRESSION(S) / ED DIAGNOSES  Chest pain    NEW MEDICATIONS STARTED DURING THIS VISIT:  New Prescriptions   No medications on file     Note:  This document was prepared using Dragon voice recognition software and may include unintentional dictation errors.     Myrna Blazer, MD 10/20/16 2130

## 2017-01-06 ENCOUNTER — Encounter: Payer: Self-pay | Admitting: Emergency Medicine

## 2017-01-06 ENCOUNTER — Emergency Department
Admission: EM | Admit: 2017-01-06 | Discharge: 2017-01-06 | Disposition: A | Discharge status: Home / Self Care | Payer: Self-pay | Attending: Emergency Medicine | Admitting: Emergency Medicine

## 2017-01-06 DIAGNOSIS — R748 Abnormal levels of other serum enzymes: Secondary | ICD-10-CM | POA: Insufficient documentation

## 2017-01-06 DIAGNOSIS — R197 Diarrhea, unspecified: Secondary | ICD-10-CM | POA: Insufficient documentation

## 2017-01-06 DIAGNOSIS — R42 Dizziness and giddiness: Secondary | ICD-10-CM | POA: Insufficient documentation

## 2017-01-06 DIAGNOSIS — Z87891 Personal history of nicotine dependence: Secondary | ICD-10-CM | POA: Insufficient documentation

## 2017-01-06 DIAGNOSIS — Z79899 Other long term (current) drug therapy: Secondary | ICD-10-CM | POA: Insufficient documentation

## 2017-01-06 DIAGNOSIS — I10 Essential (primary) hypertension: Secondary | ICD-10-CM | POA: Insufficient documentation

## 2017-01-06 LAB — URINALYSIS, COMPLETE (UACMP) WITH MICROSCOPIC
Bacteria, UA: NONE SEEN
Bilirubin Urine: NEGATIVE
Glucose, UA: NEGATIVE mg/dL
Hgb urine dipstick: NEGATIVE
KETONES UR: NEGATIVE mg/dL
Leukocytes, UA: NEGATIVE
Nitrite: NEGATIVE
PH: 6 (ref 5.0–8.0)
PROTEIN: NEGATIVE mg/dL
Specific Gravity, Urine: 1.028 (ref 1.005–1.030)

## 2017-01-06 LAB — CBC
HCT: 45.7 % (ref 40.0–52.0)
Hemoglobin: 15 g/dL (ref 13.0–18.0)
MCH: 27.8 pg (ref 26.0–34.0)
MCHC: 32.8 g/dL (ref 32.0–36.0)
MCV: 84.6 fL (ref 80.0–100.0)
PLATELETS: 251 10*3/uL (ref 150–440)
RBC: 5.4 MIL/uL (ref 4.40–5.90)
RDW: 14.1 % (ref 11.5–14.5)
WBC: 8.2 10*3/uL (ref 3.8–10.6)

## 2017-01-06 LAB — COMPREHENSIVE METABOLIC PANEL
ALBUMIN: 4.6 g/dL (ref 3.5–5.0)
ALK PHOS: 71 U/L (ref 38–126)
ALT: 23 U/L (ref 17–63)
AST: 22 U/L (ref 15–41)
Anion gap: 7 (ref 5–15)
BUN: 18 mg/dL (ref 6–20)
CHLORIDE: 106 mmol/L (ref 101–111)
CO2: 26 mmol/L (ref 22–32)
Calcium: 9.4 mg/dL (ref 8.9–10.3)
Creatinine, Ser: 1 mg/dL (ref 0.61–1.24)
GFR calc non Af Amer: >60 mL/min (ref >60)
GLUCOSE: 105 mg/dL — AB (ref 65–99)
Potassium: 3.7 mmol/L (ref 3.5–5.1)
SODIUM: 139 mmol/L (ref 135–145)
Total Bilirubin: 0.5 mg/dL (ref 0.3–1.2)
Total Protein: 8.7 g/dL — ABNORMAL HIGH (ref 6.5–8.1)

## 2017-01-06 LAB — LIPASE, BLOOD: LIPASE: 152 U/L — AB (ref 11–51)

## 2017-01-06 MED ORDER — ONDANSETRON HCL 4 MG PO TABS: 4.0000 mg | ORAL_TABLET | Freq: Three times a day (TID) | ORAL | 0 refills | Status: DC | PRN

## 2017-01-06 NOTE — ED Provider Notes (Signed)
[facility] Emergency Department Provider Note  ____________________________________________   I have reviewed the triage vital signs and the nursing notes.   HISTORY  Chief Complaint Abdominal Pain and Dizziness    HPI Michael Shaw is a 31 y.o. male  presents today complaining of loose stools.  Patient states he has no abdominal pain.  2 days ago he had nausea and abdominal pain in the lower abdomen not completely cleared up and now he has not had a satisfying bowel movement and they are somewhat loose.  He denies fever chills but he felt a little lightheaded this morning.  He denies any chest pain shortness of breath or nausea or vomiting.  He actually has not had any symptoms for 2 days of any significance specifically has had no actual nausea or vomiting or real diarrhea that he can point to.  However he does not feel quite back to normal he wants to make sure it is not "the flu".  At no time did he have any chest pain shortness of breath or exertional pain nor at any time did he have any focal numbness or weakness or significant headache   Past Medical History:  Diagnosis Date  . Hypertension   . Prediabetes     Patient Active Problem List   Diagnosis Date Noted  . Bradycardia 11/10/2013  . Elevated blood pressure 11/10/2013    History reviewed. No pertinent surgical history.  Prior to Admission medications   Medication Sig Start Date End Date Taking? Authorizing Provider  amLODipine (NORVASC) 5 MG tablet Take 5 mg by mouth daily.    [provider]  cetirizine (ZYRTEC) 10 MG tablet Take 1 tablet (10 mg total) by mouth daily. Patient not taking: Reported on 09/07/2016 12/18/15   Cuthriell, Delorise Royals, [name]  fluticasone (FLONASE) 50 MCG/ACT nasal spray Place 1 spray into both nostrils 2 (two) times daily. Patient not taking: Reported on 09/07/2016 12/18/15   Cuthriell, Delorise Royals, [name]  hydrOXYzine (ATARAX/VISTARIL) 50 MG tablet Take 1  tablet (50 mg total) by mouth 3 (three) times daily as needed. Patient not taking: Reported on 09/07/2016 01/10/16   Joni Reining, [name]  ibuprofen (ADVIL,MOTRIN) 600 MG tablet Take 1 tablet (600 mg total) by mouth every 8 (eight) hours as needed. Patient not taking: Reported on 09/07/2016 06/22/16   Joni Reining, [name]  naproxen (NAPROSYN) 500 MG tablet Take 1 tablet (500 mg total) by mouth 2 (two) times daily with a meal. Patient not taking: Reported on 09/07/2016 08/03/16   Jene Every, MD  penicillin v potassium (VEETID) 500 MG tablet Take 1 tablet (500 mg total) by mouth 4 (four) times daily. 09/07/16   Phineas Semen, MD  predniSONE (STERAPRED UNI-PAK 21 TAB) 10 MG (21) TBPK tablet Take 6 tablets on day 1, take 5 tablets on day 2, take 4 tablets on day 3, take 3 tablets on day 4, take 2 tablets on day 5, take 1 tablet on day 6 Patient not taking: Reported on 09/07/2016 07/11/16   Enid Derry, [name]  traMADol (ULTRAM) 50 MG tablet Take 1 tablet (50 mg total) by mouth every 6 (six) hours as needed. 09/07/16 09/07/17  Phineas Semen, MD    Allergies Patient has no known allergies.  Family History  Problem Relation Age of Onset  . Asthma Brother     Social History Social History  Substance Use Topics  . Smoking status: Former Smoker    Packs/day: 1.00    Years: 10.00  Types: Cigarettes  . Smokeless tobacco: Never Used  . Alcohol use Yes     Comment: rare     Review of Systems Constitutional: No fever/chills Eyes: No visual changes. ENT: No sore throat. No stiff neck no neck pain Cardiovascular: Denies chest pain. Respiratory: Denies shortness of breath. Gastrointestinal:   Refer to HPI Genitourinary: Negative for dysuria. Musculoskeletal: Negative lower extremity swelling Skin: Negative for rash. Neurological: Negative for severe headaches, focal weakness or numbness.   ____________________________________________   PHYSICAL EXAM:  VITAL SIGNS: ED Triage  Vitals  Enc Vitals Group     BP 01/06/17 1747 137/87     Pulse Rate 01/06/17 1747 97     Resp 01/06/17 1747 18     Temp 01/06/17 1747 99 F (37.2 C)     Temp Source 01/06/17 1747 Oral     SpO2 01/06/17 1747 99 %     Weight 01/06/17 1746 270 lb (122.5 kg)     Height 01/06/17 1746 6\' 3"  (1.905 m)     Head Circumference --      Peak Flow --      Pain Score 01/06/17 1743 6     Pain Loc --      Pain Edu? --      Excl. in GC? --     Constitutional: Alert and oriented. Well appearing and in no acute distress. Eyes: Conjunctivae are normal Head: Atraumatic HEENT: No congestion/rhinnorhea. Mucous membranes are moist.  Oropharynx non-erythematous Neck:   Nontender with no meningismus, no masses, no stridor Cardiovascular: Normal rate, regular rhythm. Grossly normal heart sounds.  Good peripheral circulation. Respiratory: Normal respiratory effort.  No retractions. Lungs CTAB. Abdominal: Soft and nontender. No distention. No guarding no rebound Back:  There is no focal tenderness or step off.  there is no midline tenderness there are no lesions noted. there is no CVA tenderness Musculoskeletal: No lower extremity tenderness, no upper extremity tenderness. No joint effusions, no DVT signs strong distal pulses no edema Neurologic:  Normal speech and language. No gross focal neurologic deficits are appreciated.  Skin:  Skin is warm, dry and intact. No rash noted. Psychiatric: Mood and affect are normal. Speech and behavior are normal.  ____________________________________________   LABS (all labs ordered are listed, but only abnormal results are displayed)  Labs Reviewed  LIPASE, BLOOD - Abnormal; Notable for the following:       Result Value   Lipase 152 (*)    All other components within normal limits  COMPREHENSIVE METABOLIC PANEL - Abnormal; Notable for the following:    Glucose, Bld 105 (*)    Total Protein 8.7 (*)    All other components within normal limits  URINALYSIS,  COMPLETE (UACMP) WITH MICROSCOPIC - Abnormal; Notable for the following:    Color, Urine YELLOW (*)    APPearance CLEAR (*)    Squamous Epithelial / LPF 0-5 (*)    All other components within normal limits  CBC    Pertinent labs  results that were available during my care of the patient were reviewed by me and considered in my medical decision making (see chart for details). ____________________________________________  EKG  I personally interpreted any EKGs ordered by me or triage Normal sinus rhythm at 95 bpm no acute ST elevation or depression, normal axis, no acute change from prior ____________________________________________  RADIOLOGY  Pertinent labs & imaging results that were available during my care of the patient were reviewed by me and considered in my medical decision  making (see chart for details). If possible, patient and/or family made aware of any abnormal findings. ____________________________________________    PROCEDURES  Procedure(s) performed: None  Procedures  Critical Care performed: None  ____________________________________________   INITIAL IMPRESSION / ASSESSMENT AND PLAN / ED COURSE  Pertinent labs & imaging results that were available during my care of the patient were reviewed by me and considered in my medical decision making (see chart for details).  Patient here feeling vaguely unwell.  Abdomen is completely benign.  Lipase is slightly elevated he denies alcohol use.  Electrolytes and liver function tests and CBC etc. are all reassuring.  At this time, there does not appear to be clinical evidence to support the diagnosis of pulmonary embolus, dissection, myocarditis, endocarditis, pericarditis, pericardial tamponade, acute coronary syndrome, pneumothorax, pneumonia, or any other acute intrathoracic pathology that will require admission or acute intervention.Considering the patient's symptoms, medical history, and physical examination today, I  have low suspicion for cholecystitis or biliary pathology, pancreatitis, perforation or bowel obstruction, hernia, intra-abdominal abscess, AAA or dissection, volvulus or intussusception, mesenteric ischemia, ischemic gut, pyelonephritis or appendicitis.  He ate 3 pieces of pizza today he is drinking with no difficulty he has no pain in a variety unclear significance of lipase elevation, we will refer him to GI as a precaution.  However, no evidence of CVA.  He is eating and drinking and looks quite well    ____________________________________________   FINAL CLINICAL IMPRESSION(S) / ED DIAGNOSES  Final diagnoses:  None      This chart was dictated using voice recognition software.  Despite best efforts to proofread,  errors can occur which can change meaning.      Jeanmarie PlantMcShane, James A, MD 01/06/17 2151

## 2017-01-06 NOTE — ED Triage Notes (Signed)
Pt c/o sudden onset lower abdominal pain and dizziness since Thursday. When asked to describe his abdominal pain, pt states that it's "woozy". This RN had to clarify with patient that he meant that he was dizzy. Pt confirms. Pt denies any vomiting or diarrhea, pt then asked his SO who is in room what his other symptoms were, pt's SO states that his abdomen has been "hard and bubbly". Pt is alert and oriented at this time. Pt c/o nausea, lower abdominal pain, and dizziness since Thursday, denies any vomiting or diarrhea. NAD Noted at this time.

## 2017-01-06 NOTE — Discharge Instructions (Signed)
Return to the emergency room for any new or worrisome symptoms follow closely with your doctor in the next few days if he has severe pain, lightheadedness, chest pain, shortness of breath, persistent diarrhea, bleeding, black stool you feel any other concerns please return.

## 2017-01-06 NOTE — ED Triage Notes (Signed)
Pt states last smoked marijuanna around 12pm today.

## 2017-04-16 ENCOUNTER — Encounter: Payer: Self-pay | Admitting: Emergency Medicine

## 2017-04-16 ENCOUNTER — Other Ambulatory Visit: Payer: Self-pay

## 2017-04-16 ENCOUNTER — Emergency Department
Admission: EM | Admit: 2017-04-16 | Discharge: 2017-04-16 | Disposition: A | Discharge status: Home / Self Care | Payer: Self-pay | Attending: Emergency Medicine | Admitting: Emergency Medicine

## 2017-04-16 DIAGNOSIS — R748 Abnormal levels of other serum enzymes: Secondary | ICD-10-CM | POA: Insufficient documentation

## 2017-04-16 DIAGNOSIS — K529 Noninfective gastroenteritis and colitis, unspecified: Secondary | ICD-10-CM | POA: Insufficient documentation

## 2017-04-16 DIAGNOSIS — Z87891 Personal history of nicotine dependence: Secondary | ICD-10-CM | POA: Insufficient documentation

## 2017-04-16 DIAGNOSIS — I10 Essential (primary) hypertension: Secondary | ICD-10-CM | POA: Insufficient documentation

## 2017-04-16 LAB — CBC
HCT: 42.3 % (ref 40.0–52.0)
Hemoglobin: 13.7 g/dL (ref 13.0–18.0)
MCH: 28.1 pg (ref 26.0–34.0)
MCHC: 32.5 g/dL (ref 32.0–36.0)
MCV: 86.5 fL (ref 80.0–100.0)
PLATELETS: 243 10*3/uL (ref 150–440)
RBC: 4.89 MIL/uL (ref 4.40–5.90)
RDW: 14.2 % (ref 11.5–14.5)
WBC: 6 10*3/uL (ref 3.8–10.6)

## 2017-04-16 LAB — COMPREHENSIVE METABOLIC PANEL
ALBUMIN: 4.1 g/dL (ref 3.5–5.0)
ALT: 41 U/L (ref 17–63)
ANION GAP: 6 (ref 5–15)
AST: 31 U/L (ref 15–41)
Alkaline Phosphatase: 74 U/L (ref 38–126)
BUN: 15 mg/dL (ref 6–20)
CHLORIDE: 107 mmol/L (ref 101–111)
CO2: 25 mmol/L (ref 22–32)
Calcium: 9.3 mg/dL (ref 8.9–10.3)
Creatinine, Ser: 0.95 mg/dL (ref 0.61–1.24)
GFR calc non Af Amer: >60 mL/min (ref >60)
GLUCOSE: 96 mg/dL (ref 65–99)
Potassium: 3.8 mmol/L (ref 3.5–5.1)
SODIUM: 138 mmol/L (ref 135–145)
Total Bilirubin: 0.3 mg/dL (ref 0.3–1.2)
Total Protein: 7.9 g/dL (ref 6.5–8.1)

## 2017-04-16 LAB — LIPASE, BLOOD: Lipase: 102 U/L — ABNORMAL HIGH (ref 11–51)

## 2017-04-16 NOTE — ED Triage Notes (Signed)
abd pain , N/V/D x3 days

## 2017-04-16 NOTE — ED Notes (Signed)
See triage note states he developed some upset stomach about 3-4 days ago  Positive nausea  No vomiting  But states he has had a lot of diarreha  Last loose stool was yesterday

## 2017-04-16 NOTE — ED Provider Notes (Signed)
[facility] Emergency Department Provider Note  ____________________________________________  Time seen: Approximately 12:44 PM  I have reviewed the triage vital signs and the nursing notes.   HISTORY  Chief Complaint Abdominal Pain    HPI Michael Shaw is a 32 y.o. male that presents to the emergency department for evaluation of abdominal pain and diarrhea for 4 days. He states that every time he urinates, he has a little bit of diarrhea. Last episode was yesterday. He has had some nausea but no vomiting. He has not had any abdominal pain today but pain was in the low abdomen. No upper abdominal pain.  He was drinking alcohol and eating hot wings last night for the super bowl. He is eating and drinking well. He took "a days worth" of amoxicillin 1 week ago. Daughter and wife also have diarrhea.  He needs a note for work.  No fever, chills.    Past Medical History:  Diagnosis Date  . Hypertension   . Prediabetes     Patient Active Problem List   Diagnosis Date Noted  . Bradycardia 11/10/2013  . Elevated blood pressure 11/10/2013    History reviewed. No pertinent surgical history.  Prior to Admission medications   Medication Sig Start Date End Date Taking? Authorizing Provider  amLODipine (NORVASC) 5 MG tablet Take 5 mg by mouth daily.    [provider]  cetirizine (ZYRTEC) 10 MG tablet Take 1 tablet (10 mg total) by mouth daily. Patient not taking: Reported on 09/07/2016 12/18/15   Cuthriell, Delorise Royals, [name]  fluticasone (FLONASE) 50 MCG/ACT nasal spray Place 1 spray into both nostrils 2 (two) times daily. Patient not taking: Reported on 09/07/2016 12/18/15   Cuthriell, Delorise Royals, [name]  hydrOXYzine (ATARAX/VISTARIL) 50 MG tablet Take 1 tablet (50 mg total) by mouth 3 (three) times daily as needed. Patient not taking: Reported on 09/07/2016 01/10/16   Joni Reining, [name]  ibuprofen (ADVIL,MOTRIN) 600 MG tablet Take 1 tablet (600 mg total) by  mouth every 8 (eight) hours as needed. Patient not taking: Reported on 09/07/2016 06/22/16   Joni Reining, [name]  naproxen (NAPROSYN) 500 MG tablet Take 1 tablet (500 mg total) by mouth 2 (two) times daily with a meal. Patient not taking: Reported on 09/07/2016 08/03/16   Jene Every, MD  ondansetron (ZOFRAN) 4 MG tablet Take 1 tablet (4 mg total) by mouth every 8 (eight) hours as needed for nausea or vomiting. 01/06/17   Jeanmarie Plant, MD  penicillin v potassium (VEETID) 500 MG tablet Take 1 tablet (500 mg total) by mouth 4 (four) times daily. 09/07/16   Phineas Semen, MD  predniSONE (STERAPRED UNI-PAK 21 TAB) 10 MG (21) TBPK tablet Take 6 tablets on day 1, take 5 tablets on day 2, take 4 tablets on day 3, take 3 tablets on day 4, take 2 tablets on day 5, take 1 tablet on day 6 Patient not taking: Reported on 09/07/2016 07/11/16   Enid Derry, [name]  traMADol (ULTRAM) 50 MG tablet Take 1 tablet (50 mg total) by mouth every 6 (six) hours as needed. 09/07/16 09/07/17  Phineas Semen, MD    Allergies Patient has no known allergies.  Family History  Problem Relation Age of Onset  . Asthma Brother     Social History Social History   Tobacco Use  . Smoking status: Former Smoker    Packs/day: 1.00    Years: 10.00    Pack years: 10.00    Types: Cigarettes  .  Smokeless tobacco: Never Used  Substance Use Topics  . Alcohol use: Yes    Comment: rare   . Drug use: Yes    Types: Marijuana    Comment: Patient recently quit smoking marjuana in July 2015; smoked marjuana since high school.     Review of Systems  Constitutional: No fever/chills Cardiovascular: No chest pain. Respiratory: No SOB. Musculoskeletal: Negative for musculoskeletal pain. Skin: Negative for rash, abrasions, lacerations, ecchymosis.   ____________________________________________   PHYSICAL EXAM:  VITAL SIGNS: ED Triage Vitals [04/16/17 1220]  Enc Vitals Group     BP 136/83     Pulse Rate 76     Resp  18     Temp 98.3 F (36.8 C)     Temp Source Oral     SpO2 100 %     Weight 269 lb (122 kg)     Height 6\' 3"  (1.905 m)     Head Circumference      Peak Flow      Pain Score 6     Pain Loc      Pain Edu?      Excl. in GC?      Constitutional: Alert and oriented. Well appearing and in no acute distress. Eyes: Conjunctivae are normal. PERRL. EOMI. Head: Atraumatic. ENT:      Ears:      Nose: No congestion/rhinnorhea.      Mouth/Throat: Mucous membranes are moist.  Neck: No stridor.   Cardiovascular: Normal rate, regular rhythm.  Good peripheral circulation. Respiratory: Normal respiratory effort without tachypnea or retractions. Lungs CTAB. Good air entry to the bases with no decreased or absent breath sounds. Gastrointestinal: Bowel sounds 4 quadrants. Soft and nontender to palpation. No guarding or rigidity. No palpable masses. No distention. No CVA tenderness. Musculoskeletal: Full range of motion to all extremities. No gross deformities appreciated. Neurologic:  Normal speech and language. No gross focal neurologic deficits are appreciated.  Skin:  Skin is warm, dry and intact. No rash noted.    ____________________________________________   LABS (all labs ordered are listed, but only abnormal results are displayed)  Labs Reviewed  LIPASE, BLOOD - Abnormal; Notable for the following components:      Result Value   Lipase 102 (*)    All other components within normal limits  CBC  COMPREHENSIVE METABOLIC PANEL   ____________________________________________  EKG   ____________________________________________  RADIOLOGY   No results found.  ____________________________________________    PROCEDURES  Procedure(s) performed:    Procedures    Medications - No data to display   ____________________________________________   INITIAL IMPRESSION / ASSESSMENT AND PLAN / ED COURSE  Pertinent labs & imaging results that were available during my care  of the patient were reviewed by me and considered in my medical decision making (see chart for details).  Review of the [place] CSRS was performed in accordance of the [facility] prior to dispensing any controlled drugs.     Patient presented to the emergency department for evaluation of 4 days of diarrhea and lower abdominal pain.  Vital signs and exam are reassuring.  Lipase was elevated, double of normal.  This is likely not an acute finding.  Patient is not having any vomiting or upper abdominal pain.  He has not had any abdominal pain today.  No vomiting.  Abdomen is soft and nontender to palpation.  He appears well and is up walking around the room.  These findings were discussed with patient and he does not want  a CT scan of abdomen today.  He will follow-up with his primary care provider tomorrow and will return to the emergency department if he has any vomiting or upper abdominal pain.  Wife and daughter have also had diarrhea so this is likely viral gastroenteritis.  Patient will begin a full liquid diet for the next 24 hours.  Patient is to follow up with PCP as directed. Patient is given ED precautions to return to the ED for any worsening or new symptoms.     ____________________________________________  FINAL CLINICAL IMPRESSION(S) / ED DIAGNOSES  Final diagnoses:  Elevated lipase  Gastroenteritis      NEW MEDICATIONS STARTED DURING THIS VISIT:  ED Discharge Orders    None          This chart was dictated using voice recognition software/Dragon. Despite best efforts to proofread, errors can occur which can change the meaning. Any change was purely unintentional.    Enid Derry, [name] 04/16/17 1836    Emily Filbert, MD 04/17/17 [phone]

## 2017-06-17 ENCOUNTER — Emergency Department
Admission: EM | Admit: 2017-06-17 | Discharge: 2017-06-17 | Disposition: A | Discharge status: Home / Self Care | Payer: Self-pay | Attending: Emergency Medicine | Admitting: Emergency Medicine

## 2017-06-17 ENCOUNTER — Other Ambulatory Visit: Payer: Self-pay

## 2017-06-17 ENCOUNTER — Encounter: Payer: Self-pay | Admitting: Emergency Medicine

## 2017-06-17 ENCOUNTER — Emergency Department: Payer: Self-pay

## 2017-06-17 DIAGNOSIS — Z87891 Personal history of nicotine dependence: Secondary | ICD-10-CM | POA: Insufficient documentation

## 2017-06-17 DIAGNOSIS — R109 Unspecified abdominal pain: Secondary | ICD-10-CM

## 2017-06-17 DIAGNOSIS — R1013 Epigastric pain: Secondary | ICD-10-CM

## 2017-06-17 DIAGNOSIS — I10 Essential (primary) hypertension: Secondary | ICD-10-CM | POA: Insufficient documentation

## 2017-06-17 DIAGNOSIS — Z79899 Other long term (current) drug therapy: Secondary | ICD-10-CM | POA: Insufficient documentation

## 2017-06-17 DIAGNOSIS — K2970 Gastritis, unspecified, without bleeding: Secondary | ICD-10-CM | POA: Insufficient documentation

## 2017-06-17 LAB — URINALYSIS, COMPLETE (UACMP) WITH MICROSCOPIC
BILIRUBIN URINE: NEGATIVE
Bacteria, UA: NONE SEEN
Glucose, UA: NEGATIVE mg/dL
HGB URINE DIPSTICK: NEGATIVE
Ketones, ur: NEGATIVE mg/dL
LEUKOCYTES UA: NEGATIVE
Nitrite: NEGATIVE
PH: 7 (ref 5.0–8.0)
Protein, ur: NEGATIVE mg/dL
RBC / HPF: NONE SEEN RBC/hpf (ref 0–5)
SPECIFIC GRAVITY, URINE: 1.018 (ref 1.005–1.030)

## 2017-06-17 LAB — CBC
HCT: 43 % (ref 40.0–52.0)
Hemoglobin: 14.1 g/dL (ref 13.0–18.0)
MCH: 27.9 pg (ref 26.0–34.0)
MCHC: 32.8 g/dL (ref 32.0–36.0)
MCV: 85.1 fL (ref 80.0–100.0)
PLATELETS: 259 10*3/uL (ref 150–440)
RBC: 5.05 MIL/uL (ref 4.40–5.90)
RDW: 14.1 % (ref 11.5–14.5)
WBC: 6.6 10*3/uL (ref 3.8–10.6)

## 2017-06-17 LAB — COMPREHENSIVE METABOLIC PANEL
ALT: 29 U/L (ref 17–63)
ANION GAP: 6 (ref 5–15)
AST: 28 U/L (ref 15–41)
Albumin: 4.2 g/dL (ref 3.5–5.0)
Alkaline Phosphatase: 72 U/L (ref 38–126)
BUN: 18 mg/dL (ref 6–20)
CHLORIDE: 106 mmol/L (ref 101–111)
CO2: 26 mmol/L (ref 22–32)
CREATININE: 1.19 mg/dL (ref 0.61–1.24)
Calcium: 9.3 mg/dL (ref 8.9–10.3)
Glucose, Bld: 108 mg/dL — ABNORMAL HIGH (ref 65–99)
Potassium: 3.8 mmol/L (ref 3.5–5.1)
Sodium: 138 mmol/L (ref 135–145)
Total Bilirubin: 0.5 mg/dL (ref 0.3–1.2)
Total Protein: 8 g/dL (ref 6.5–8.1)

## 2017-06-17 LAB — LIPASE, BLOOD: LIPASE: 66 U/L — AB (ref 11–51)

## 2017-06-17 MED ORDER — GI COCKTAIL ~~LOC~~
30.0000 mL | Freq: Once | ORAL | Status: AC
  Administered 2017-06-17: 30 mL via ORAL
  Filled 2017-06-17: qty 30

## 2017-06-17 NOTE — ED Triage Notes (Signed)
Patient with complaint of epigastric pain that started yesterday. Patient denies nausea and vomiting.

## 2017-06-17 NOTE — ED Notes (Signed)
Esign not working, pt verbalized discharge instructions and has no questions at this time. Pt also given types medication that he can take.

## 2017-06-17 NOTE — ED Provider Notes (Signed)
[facility] Emergency Department Provider Note   ____________________________________________   First MD Initiated Contact with Patient 06/17/17 [phone]     (approximate)  I have reviewed the triage vital signs and the nursing notes.   HISTORY  Chief Complaint Abdominal Pain    HPI Michael Shaw is a 32 y.o. male reports a history of hypertension  Patient reports at 6 PM last night while her grilling, he went to lift the top of his drill and started experiencing discomfort just in the very bottom of his chest and upper mid abdomen.  Reports it feels like somebody filled up a balloon in his upper stomach.  No nausea or vomiting.  Denies that there is "chest pain" rather reports it feels like it is upper stomach.  Does not radiate to his back.  No fevers or chills.  He has not had any nausea or vomiting.  Patient reports that he took some Gas-X and ibuprofen and he started to begin having relief about 4 AM but still has a mild discomfort in the area.  He also reports his wife recently had issues with her gallbladder and he thinks it he would like to make sure that that is not the issue.  Also reports when he woke up this morning from resting he had a weird acid taste in the back of his mouth, and he is chewing bubble gum now because of the bad flavor.   Past Medical History:  Diagnosis Date  . Hypertension   . Prediabetes     Patient Active Problem List   Diagnosis Date Noted  . Bradycardia 11/10/2013  . Elevated blood pressure 11/10/2013    History reviewed. No pertinent surgical history.  Prior to Admission medications   Medication Sig Start Date End Date Taking? Authorizing Provider  amLODipine (NORVASC) 5 MG tablet Take 5 mg by mouth daily.    [provider]  cetirizine (ZYRTEC) 10 MG tablet Take 1 tablet (10 mg total) by mouth daily. Patient not taking: Reported on 09/07/2016 12/18/15   Cuthriell, Delorise Royals, [name]  fluticasone  (FLONASE) 50 MCG/ACT nasal spray Place 1 spray into both nostrils 2 (two) times daily. Patient not taking: Reported on 09/07/2016 12/18/15   Cuthriell, Delorise Royals, [name]  hydrOXYzine (ATARAX/VISTARIL) 50 MG tablet Take 1 tablet (50 mg total) by mouth 3 (three) times daily as needed. Patient not taking: Reported on 09/07/2016 01/10/16   Joni Reining, [name]  ibuprofen (ADVIL,MOTRIN) 600 MG tablet Take 1 tablet (600 mg total) by mouth every 8 (eight) hours as needed. Patient not taking: Reported on 09/07/2016 06/22/16   Joni Reining, [name]  naproxen (NAPROSYN) 500 MG tablet Take 1 tablet (500 mg total) by mouth 2 (two) times daily with a meal. Patient not taking: Reported on 09/07/2016 08/03/16   Jene Every, MD  ondansetron (ZOFRAN) 4 MG tablet Take 1 tablet (4 mg total) by mouth every 8 (eight) hours as needed for nausea or vomiting. 01/06/17   Jeanmarie Plant, MD  penicillin v potassium (VEETID) 500 MG tablet Take 1 tablet (500 mg total) by mouth 4 (four) times daily. 09/07/16   Phineas Semen, MD  predniSONE (STERAPRED UNI-PAK 21 TAB) 10 MG (21) TBPK tablet Take 6 tablets on day 1, take 5 tablets on day 2, take 4 tablets on day 3, take 3 tablets on day 4, take 2 tablets on day 5, take 1 tablet on day 6 Patient not taking: Reported on 09/07/2016 07/11/16  Enid DerryWagner, Ashley, [name]  traMADol (ULTRAM) 50 MG tablet Take 1 tablet (50 mg total) by mouth every 6 (six) hours as needed. 09/07/16 09/07/17  Phineas SemenGoodman, Graydon, MD    Allergies Patient has no known allergies.  Family History  Problem Relation Age of Onset  . Asthma Brother     Social History Social History   Tobacco Use  . Smoking status: Former Smoker    Packs/day: 1.00    Years: 10.00    Pack years: 10.00    Types: Cigarettes  . Smokeless tobacco: Never Used  Substance Use Topics  . Alcohol use: Yes    Comment: rare   . Drug use: Yes    Types: Marijuana    Comment: Patient recently quit smoking marjuana in July 2015; smoked  marjuana since high school.    Review of Systems Constitutional: No fever/chills Eyes: No visual changes. ENT: No sore throat. Cardiovascular: Denies chest pain. Respiratory: Denies shortness of breath. Gastrointestinal:   No nausea, no vomiting.  No diarrhea.  No constipation. Genitourinary: Negative for dysuria. Musculoskeletal: Negative for back pain. Skin: Negative for rash. Neurological: Negative for headaches or weakness.    ____________________________________________   PHYSICAL EXAM:  VITAL SIGNS: ED Triage Vitals  Enc Vitals Group     BP 06/17/17 0704 140/89     Pulse Rate 06/17/17 0704 75     Resp 06/17/17 0704 15     Temp 06/17/17 0704 98.2 F (36.8 C)     Temp Source 06/17/17 0704 Oral     SpO2 06/17/17 0704 98 %     Weight 06/17/17 0657 280 lb (127 kg)     Height 06/17/17 0657 6\' 3"  (1.905 m)     Head Circumference --      Peak Flow --      Pain Score 06/17/17 0657 10     Pain Loc --      Pain Edu? --      Excl. in GC? --     Constitutional: Alert and oriented. Well appearing and in no acute distress. Eyes: Conjunctivae are normal. Head: Atraumatic. Nose: No congestion/rhinnorhea. Mouth/Throat: Mucous membranes are moist. Neck: No stridor.   Cardiovascular: Normal rate, regular rhythm. Grossly normal heart sounds.  Good peripheral circulation. Respiratory: Normal respiratory effort.  No retractions. Lungs CTAB. Gastrointestinal: Soft and nontender except for mild discomfort in the epigastrium and minimal tenderness in the right upper quadrant without rebound or guarding.  There is no peritonitis.  Normal bowel sounds.. No distention. Musculoskeletal: No lower extremity tenderness nor edema. Neurologic:  Normal speech and language. No gross focal neurologic deficits are appreciated.  Skin:  Skin is warm, dry and intact. No rash noted. Psychiatric: Mood and affect are normal. Speech and behavior are  normal.  ____________________________________________   LABS (all labs ordered are listed, but only abnormal results are displayed)  Labs Reviewed  LIPASE, BLOOD - Abnormal; Notable for the following components:      Result Value   Lipase 66 (*)    All other components within normal limits  COMPREHENSIVE METABOLIC PANEL - Abnormal; Notable for the following components:   Glucose, Bld 108 (*)    All other components within normal limits  URINALYSIS, COMPLETE (UACMP) WITH MICROSCOPIC - Abnormal; Notable for the following components:   Color, Urine YELLOW (*)    APPearance CLOUDY (*)    Squamous Epithelial / LPF 0-5 (*)    All other components within normal limits  CBC   ____________________________________________  EKG  ED ECG REPORT I, Sharyn Creamer, the attending physician, personally viewed and interpreted this ECG.  Date: 06/17/2017 EKG Time: 705 Rate: 80 Rhythm: normal sinus rhythm QRS Axis: normal Intervals: normal ST/T Wave abnormalities: normal Narrative Interpretation: no evidence of acute ischemia  ____________________________________________  RADIOLOGY  Right upper quadrant ultrasound was negative for acute finding.  A small lesion is noted in the left hepatic lobe, thought to be a hemangioma which is benign.  I did discuss this with the patient and also recommend he have a repeat ultrasound in 6-12 months and establish gastroenterology follow-up.  Patient is agreeable. ____________________________________________   PROCEDURES  Procedure(s) performed: None  Procedures  Critical Care performed: No  ____________________________________________   INITIAL IMPRESSION / ASSESSMENT AND PLAN / ED COURSE  Pertinent labs & imaging results that were available during my care of the patient were reviewed by me and considered in my medical decision making (see chart for details).  Differential diagnosis includes but is not limited to, abdominal perforation,  aortic dissection, cholecystitis, appendicitis, diverticulitis, colitis, esophagitis/gastritis, kidney stone, pyelonephritis, urinary tract infection, aortic aneurysm. All are considered in decision and treatment plan. Based upon the patient's presentation and risk factors, I find the patient has no evidence of acute abdomen his history and clinical exam very low risk for acute abdominal catastrophe/emergency.  Very reassuring examination without any evidence of peritonitis.  He does have mild tenderness in the epigastrium, complains of a feeling of an acid taste in his mouth when he woke up in a bubble or hard to describe discomfort mostly in the epigastrium.  The would be reasonable to evaluate with a right upper quadrant ultrasound, check LFTs and lipase.  EKG is reassuring, patient denies having chest pain but rather appears to be primarily upper abdominal nature.  Differential diagnosis would certainly include reflux esophagitis, cholelithiasis, pancreatitis, gastritis, peptic ulcer disease etc.   ----------------------------------------- 10:35 AM on 06/17/2017 -----------------------------------------  Patient feels much improved.  Lab work reassuring.  A very minimal elevation of his lipase is seen, and he has had this previous as well which today seems improved.  Suspect today likely suffering some type of mild gastritis versus reflux versus far less likely pancreatitis.  Pain-free.  Abdomen soft nontender nondistended in all quadrants.  No evidence of acute abdomen.  He reports he feels much better and his symptoms gone away after a GI cocktail.  Discussed ultrasound findings, recommended follow-up with his primary doctor as well as gastroenterology.  Careful return precautions reviewed.  Return precautions and treatment recommendations and follow-up discussed with the patient who is agreeable with the plan.       ____________________________________________   FINAL CLINICAL IMPRESSION(S)  / ED DIAGNOSES  Final diagnoses:  Abdominal pain  Epigastric pain  Gastritis without bleeding, unspecified chronicity, unspecified gastritis type      NEW MEDICATIONS STARTED DURING THIS VISIT:  New Prescriptions   No medications on file     Note:  This document was prepared using Dragon voice recognition software and may include unintentional dictation errors.     Sharyn Creamer, MD 06/17/17 [phone]

## 2017-06-17 NOTE — Discharge Instructions (Addendum)
You were seen in the emergency room for abdominal pain. It is important that you follow up closely with your primary care doctor in the next couple of days and gastroenterology.  Also, you should have another ultrasound of your liver in 6 to 12 months to assure stability of the nodule noted on today's evaluation.  If you're unable to see her primary care doctor you may return to the emergency room or go to the [facility] walk-in clinic in 1 or 2 days for reexam.  Please return to the emergency room right away if you are to develop a fever, severe nausea, your pain becomes severe or worsens, you are unable to keep food down, begin vomiting any dark or bloody fluid, you develop any dark or bloody stools, feel dehydrated, or other new concerns or symptoms arise.

## 2017-07-10 ENCOUNTER — Other Ambulatory Visit: Payer: Self-pay

## 2017-07-10 ENCOUNTER — Encounter: Payer: Self-pay | Admitting: Emergency Medicine

## 2017-07-10 ENCOUNTER — Emergency Department
Admission: EM | Admit: 2017-07-10 | Discharge: 2017-07-10 | Disposition: A | Discharge status: Home / Self Care | Payer: Self-pay | Attending: Emergency Medicine | Admitting: Emergency Medicine

## 2017-07-10 DIAGNOSIS — Z79899 Other long term (current) drug therapy: Secondary | ICD-10-CM | POA: Insufficient documentation

## 2017-07-10 DIAGNOSIS — Z87891 Personal history of nicotine dependence: Secondary | ICD-10-CM | POA: Insufficient documentation

## 2017-07-10 DIAGNOSIS — R531 Weakness: Secondary | ICD-10-CM | POA: Insufficient documentation

## 2017-07-10 DIAGNOSIS — I10 Essential (primary) hypertension: Secondary | ICD-10-CM | POA: Insufficient documentation

## 2017-07-10 DIAGNOSIS — Y9389 Activity, other specified: Secondary | ICD-10-CM | POA: Insufficient documentation

## 2017-07-10 DIAGNOSIS — S46912A Strain of unspecified muscle, fascia and tendon at shoulder and upper arm level, left arm, initial encounter: Secondary | ICD-10-CM | POA: Insufficient documentation

## 2017-07-10 DIAGNOSIS — Y999 Unspecified external cause status: Secondary | ICD-10-CM | POA: Insufficient documentation

## 2017-07-10 DIAGNOSIS — Y929 Unspecified place or not applicable: Secondary | ICD-10-CM | POA: Insufficient documentation

## 2017-07-10 DIAGNOSIS — X500XXA Overexertion from strenuous movement or load, initial encounter: Secondary | ICD-10-CM | POA: Insufficient documentation

## 2017-07-10 DIAGNOSIS — T148XXA Other injury of unspecified body region, initial encounter: Secondary | ICD-10-CM

## 2017-07-10 LAB — CBC
HCT: 44.3 % (ref 40.0–52.0)
Hemoglobin: 14.5 g/dL (ref 13.0–18.0)
MCH: 28.2 pg (ref 26.0–34.0)
MCHC: 32.8 g/dL (ref 32.0–36.0)
MCV: 85.8 fL (ref 80.0–100.0)
PLATELETS: 249 10*3/uL (ref 150–440)
RBC: 5.15 MIL/uL (ref 4.40–5.90)
RDW: 14.3 % (ref 11.5–14.5)
WBC: 6.3 10*3/uL (ref 3.8–10.6)

## 2017-07-10 LAB — BASIC METABOLIC PANEL
Anion gap: 5 (ref 5–15)
BUN: 15 mg/dL (ref 6–20)
CHLORIDE: 108 mmol/L (ref 101–111)
CO2: 24 mmol/L (ref 22–32)
CREATININE: 1.05 mg/dL (ref 0.61–1.24)
Calcium: 9.3 mg/dL (ref 8.9–10.3)
Glucose, Bld: 108 mg/dL — ABNORMAL HIGH (ref 65–99)
Potassium: 3.5 mmol/L (ref 3.5–5.1)
SODIUM: 137 mmol/L (ref 135–145)

## 2017-07-10 MED ORDER — OXYCODONE-ACETAMINOPHEN 5-325 MG PO TABS
1.0000 | ORAL_TABLET | Freq: Once | ORAL | Status: AC
  Administered 2017-07-10: 1 via ORAL
  Filled 2017-07-10: qty 1

## 2017-07-10 MED ORDER — DICLOFENAC SODIUM 1 % TD GEL: TRANSDERMAL | 0 refills | Status: DC

## 2017-07-10 MED ORDER — KETOROLAC TROMETHAMINE 30 MG/ML IJ SOLN
30.0000 mg | Freq: Once | INTRAMUSCULAR | Status: AC
  Administered 2017-07-10: 30 mg via INTRAMUSCULAR
  Filled 2017-07-10: qty 1

## 2017-07-10 MED ORDER — CARISOPRODOL 350 MG PO TABS: 350.0000 mg | ORAL_TABLET | Freq: Three times a day (TID) | ORAL | 0 refills | Status: DC | PRN

## 2017-07-10 NOTE — ED Provider Notes (Signed)
[facility] Emergency Department Provider Note  ___________________________________________   First MD Initiated Contact with Patient 07/10/17 [phone]     (approximate)  I have reviewed the triage vital signs and the nursing notes.   HISTORY  Chief Complaint Weakness and Shoulder Pain   HPI Michael Shaw is a 32 y.o. male with a history of hypertension as well as prediabetes who is presenting to the emergency department today with left shoulder pain over the past 5 days.  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  He says that he was lifting concrete when he had a sudden onset pain to his left thoracic back as well as to the left upper and outer anterior chest.  He says the pain is worse with movement of the left upper extremity.  Says that he is taken ibuprofen without relief.  Says that he usually drinks a lot of sugary drinks and also woke up feeling weak and "dehydrated" this morning.  Says that he has been urinating normally in the urine is clear.   Past Medical History:  Diagnosis Date  . Hypertension   . Prediabetes     Patient Active Problem List   Diagnosis Date Noted  . Bradycardia 11/10/2013  . Elevated blood pressure 11/10/2013    History reviewed. No pertinent surgical history.  Prior to Admission medications   Medication Sig Start Date End Date Taking? Authorizing Provider  amLODipine (NORVASC) 5 MG tablet Take 5 mg by mouth daily.    [provider]  cetirizine (ZYRTEC) 10 MG tablet Take 1 tablet (10 mg total) by mouth daily. Patient not taking: Reported on 09/07/2016 12/18/15   Cuthriell, Delorise Royals, [name]  fluticasone (FLONASE) 50 MCG/ACT nasal spray Place 1 spray into both nostrils 2 (two) times daily. Patient not taking: Reported on 09/07/2016 12/18/15   Cuthriell, Delorise Royals, [name]  hydrOXYzine (ATARAX/VISTARIL) 50 MG tablet Take 1 tablet (50 mg total) by  mouth 3 (three) times daily as needed. Patient not taking: Reported on 09/07/2016 01/10/16   Joni Reining, [name]  ibuprofen (ADVIL,MOTRIN) 600 MG tablet Take 1 tablet (600 mg total) by mouth every 8 (eight) hours as needed. Patient not taking: Reported on 09/07/2016 06/22/16   Joni Reining, [name]  naproxen (NAPROSYN) 500 MG tablet Take 1 tablet (500 mg total) by mouth 2 (two) times daily with a meal. Patient not taking: Reported on 09/07/2016 08/03/16   Jene Every, MD  ondansetron (ZOFRAN) 4 MG tablet Take 1 tablet (4 mg total) by mouth every 8 (eight) hours as needed for nausea or vomiting. 01/06/17   Jeanmarie Plant, MD  penicillin v potassium (VEETID) 500 MG tablet Take 1 tablet (500 mg total) by mouth 4 (four) times daily. 09/07/16   Phineas Semen, MD  predniSONE (STERAPRED UNI-PAK 21 TAB) 10 MG (21) TBPK tablet Take 6 tablets on day 1, take 5 tablets on day 2, take 4 tablets on day 3, take 3 tablets on day 4, take 2 tablets on day 5, take 1 tablet on day 6 Patient not taking: Reported on 09/07/2016 07/11/16   Enid Derry, [name]  traMADol (ULTRAM) 50 MG tablet Take 1 tablet (50 mg total) by mouth every 6 (six) hours as needed. 09/07/16 09/07/17  Phineas Semen, MD    Allergies Patient has no known allergies.  Family History  Problem Relation Age of Onset  . Asthma Brother  Social History Social History   Tobacco Use  . Smoking status: Former Smoker    Packs/day: 1.00    Years: 10.00    Pack years: 10.00    Types: Cigarettes  . Smokeless tobacco: Never Used  Substance Use Topics  . Alcohol use: Yes    Comment: rare   . Drug use: Yes    Types: Marijuana    Comment: Patient recently quit smoking marjuana in July 2015; smoked marjuana since high school.    Review of Systems  Constitutional: No fever/chills Eyes: No visual changes. ENT: No sore throat. Cardiovascular: Denies chest pain. Respiratory: Denies shortness of breath. Gastrointestinal: No abdominal pain.   No nausea, no vomiting.  No diarrhea.  No constipation. Genitourinary: Negative for dysuria. Musculoskeletal: As above Skin: Negative for rash. Neurological: Negative for headaches, focal weakness or numbness.   ____________________________________________   PHYSICAL EXAM:  VITAL SIGNS: ED Triage Vitals  Enc Vitals Group     BP 07/10/17 1423 (!) 141/91     Pulse Rate 07/10/17 1423 82     Resp 07/10/17 1423 18     Temp 07/10/17 1423 97.6 F (36.4 C)     Temp Source 07/10/17 1423 Oral     SpO2 07/10/17 1423 98 %     Weight 07/10/17 1421 280 lb (127 kg)     Height 07/10/17 1421  (1.905 m)     Head Circumference --      Peak Flow --      Pain Score 07/10/17 1421 7     Pain Loc --      Pain Edu? --      Excl. in GC? --     Constitutional: Alert and oriented. Well appearing and in no acute distress. Eyes: Conjunctivae are normal.  Head: Atraumatic. Nose: No congestion/rhinnorhea. Mouth/Throat: Mucous membranes are moist.  Neck: No stridor.   Cardiovascular: Normal rate, regular rhythm. Grossly normal heart sounds.  Good peripheral circulation with equal and bilateral radial pulses. Respiratory: Normal respiratory effort.  No retractions. Lungs CTAB. Gastrointestinal: Soft and nontender. No distention.  Musculoskeletal: No lower extremity tenderness nor edema.  No joint effusions.  Visible pain when the patient ranges his left humerus.  However, denies pain to the bony shoulder joint.  Tenderness to palpation to the left pectoralis major muscle as well as a left-sided rhomboid groups without any deformity, step-off or ecchymosis.  No crepitus.  Neurologic:  Normal speech and language. No gross focal neurologic deficits are appreciated. Skin:  Skin is warm, dry and intact. No rash noted. Psychiatric: Mood and affect are normal. Speech and behavior are normal.  ____________________________________________   LABS (all labs ordered are listed, but only abnormal results  are displayed)  Labs Reviewed  BASIC METABOLIC PANEL - Abnormal; Notable for the following components:      Result Value   Glucose, Bld 108 (*)    All other components within normal limits  CBC  URINALYSIS, COMPLETE (UACMP) WITH MICROSCOPIC  CBG MONITORING, ED   ____________________________________________  EKG  ED ECG REPORT I, Arelia Longest, the attending physician, personally viewed and interpreted this ECG.   Date: 07/10/2017  EKG Time: 1422  Rate: 105  Rhythm: sinus tachycardia  Axis: Normal  Intervals:Incomplete right bundle branch block.  ST&T Change: No ST segment elevation or depression.  No abnormal T wave inversion.  ____________________________________________  RADIOLOGY   ____________________________________________   PROCEDURES  Procedure(s) performed:   Procedures  Critical Care performed:   ____________________________________________  INITIAL IMPRESSION / ASSESSMENT AND PLAN / ED COURSE  Pertinent labs & imaging results that were available during my care of the patient were reviewed by me and considered in my medical decision making (see chart for details).  DDX: Left leg abnormality, dehydration, muscle strain, fracture, muscle spasm As part of my medical decision making, I reviewed the following data within the electronic MEDICAL RECORD NUMBER Notes from prior ED visits  Patient with exam consistent with muscle strain.  We will give Percocet as well as a Toradol shot in the emergency department and he will be discharged with [facility] as well as diclofenac gel.  We also discussed decreasing his heavy lifting and I will give him a work note.  Patient with heart rate of 82 on the monitor.  Do not suspect cardiac or pulmonary etiology given the exam and history.  Patient understanding of the treatment plan and willing to comply. ____________________________________________   FINAL CLINICAL IMPRESSION(S) / ED DIAGNOSES  Muscle strain.    NEW  MEDICATIONS STARTED DURING THIS VISIT:  New Prescriptions   No medications on file     Note:  This document was prepared using Dragon voice recognition software and may include unintentional dictation errors.     Myrna Blazer, MD 07/10/17 [phone]

## 2017-07-10 NOTE — ED Triage Notes (Signed)
Pt to ED via POV c/o left should pain since Friday and also reports of weakness. Pt states that he thinks that he may be dehydrated. Pt denies N/V. Pt is in NAD at this time.

## 2017-07-10 NOTE — ED Notes (Signed)
Pt feels dehydrated today.  Reports drinks only sports drinks and no water. Does heavy manual work.  Explained needs to drink some water as well because those drinks have a lot of sugar in them.  ambulatory with steady gait.  No NVD. No fevers

## 2017-07-25 ENCOUNTER — Other Ambulatory Visit: Payer: Self-pay

## 2017-07-25 ENCOUNTER — Emergency Department
Admission: EM | Admit: 2017-07-25 | Discharge: 2017-07-25 | Disposition: A | Discharge status: Home / Self Care | Payer: Self-pay | Attending: Emergency Medicine | Admitting: Emergency Medicine

## 2017-07-25 DIAGNOSIS — Z79899 Other long term (current) drug therapy: Secondary | ICD-10-CM | POA: Insufficient documentation

## 2017-07-25 DIAGNOSIS — Z87891 Personal history of nicotine dependence: Secondary | ICD-10-CM | POA: Insufficient documentation

## 2017-07-25 DIAGNOSIS — R197 Diarrhea, unspecified: Secondary | ICD-10-CM | POA: Insufficient documentation

## 2017-07-25 DIAGNOSIS — I10 Essential (primary) hypertension: Secondary | ICD-10-CM | POA: Insufficient documentation

## 2017-07-25 LAB — URINALYSIS, COMPLETE (UACMP) WITH MICROSCOPIC
BACTERIA UA: NONE SEEN
BILIRUBIN URINE: NEGATIVE
Glucose, UA: NEGATIVE mg/dL
HGB URINE DIPSTICK: NEGATIVE
Ketones, ur: NEGATIVE mg/dL
Leukocytes, UA: NEGATIVE
Nitrite: NEGATIVE
PROTEIN: NEGATIVE mg/dL
SQUAMOUS EPITHELIAL / LPF: NONE SEEN (ref 0–5)
Specific Gravity, Urine: 1.01 (ref 1.005–1.030)
pH: 7 (ref 5.0–8.0)

## 2017-07-25 LAB — COMPREHENSIVE METABOLIC PANEL
ALBUMIN: 4.3 g/dL (ref 3.5–5.0)
ALK PHOS: 78 U/L (ref 38–126)
ALT: 29 U/L (ref 17–63)
AST: 30 U/L (ref 15–41)
Anion gap: 8 (ref 5–15)
BILIRUBIN TOTAL: 0.5 mg/dL (ref 0.3–1.2)
BUN: 17 mg/dL (ref 6–20)
CALCIUM: 9.3 mg/dL (ref 8.9–10.3)
CHLORIDE: 105 mmol/L (ref 101–111)
CO2: 22 mmol/L (ref 22–32)
Creatinine, Ser: 0.87 mg/dL (ref 0.61–1.24)
GFR calc Af Amer: >60 mL/min (ref >60)
GLUCOSE: 109 mg/dL — AB (ref 65–99)
Potassium: 3.7 mmol/L (ref 3.5–5.1)
Sodium: 135 mmol/L (ref 135–145)
Total Protein: 8.5 g/dL — ABNORMAL HIGH (ref 6.5–8.1)

## 2017-07-25 LAB — CBC
HEMATOCRIT: 44.2 % (ref 40.0–52.0)
HEMOGLOBIN: 14.5 g/dL (ref 13.0–18.0)
MCH: 27.8 pg (ref 26.0–34.0)
MCHC: 32.8 g/dL (ref 32.0–36.0)
MCV: 84.6 fL (ref 80.0–100.0)
Platelets: 253 10*3/uL (ref 150–440)
RBC: 5.23 MIL/uL (ref 4.40–5.90)
RDW: 14.3 % (ref 11.5–14.5)
WBC: 6.4 10*3/uL (ref 3.8–10.6)

## 2017-07-25 LAB — LIPASE, BLOOD: Lipase: 60 U/L — ABNORMAL HIGH (ref 11–51)

## 2017-07-25 MED ORDER — DICYCLOMINE HCL 10 MG PO CAPS: 10.0000 mg | ORAL_CAPSULE | Freq: Three times a day (TID) | ORAL | 0 refills | Status: DC | PRN

## 2017-07-25 NOTE — ED Notes (Signed)
AAOx3.  Skin warm and dry.  Ambulates with easy and steady gait.  Eating [employer] and tolerating weill.  NAD

## 2017-07-25 NOTE — Discharge Instructions (Signed)
Return to the ER for new, worsening, or persistent diarrhea, blood in the stool, fevers, weakness, sustained abdominal pain, or any other new or worsening symptoms that concern you.

## 2017-07-25 NOTE — ED Notes (Signed)
AAOx3.  Skin warm and dry.  NAD 

## 2017-07-25 NOTE — ED Provider Notes (Signed)
[facility] Emergency Department Provider Note ____________________________________________   First MD Initiated Contact with Patient 07/25/17 1439     (approximate)  I have reviewed the triage vital signs and the nursing notes.   HISTORY  Chief Complaint Diarrhea    HPI Michael Shaw is a 32 y.o. male with PMH as noted below who presents with diarrhea, occurring over the last 3 days, persistent course, and consisting of 4-5 episodes of diarrhea per day which has been interfering with his work.  The patient states that associated with the diarrhea, he has intermittent abdominal crampy pain and moments of diaphoresis which then resolved after that he uses the bathroom.  He denies sustained abdominal pain, fever, nausea or vomiting, or any blood in the stool.  No sick contacts.  Patient states his appetite is been normal.  Past Medical History:  Diagnosis Date  . Hypertension   . Prediabetes     Patient Active Problem List   Diagnosis Date Noted  . Bradycardia 11/10/2013  . Elevated blood pressure 11/10/2013    History reviewed. No pertinent surgical history.  Prior to Admission medications   Medication Sig Start Date End Date Taking? Authorizing Provider  amLODipine (NORVASC) 5 MG tablet Take 5 mg by mouth daily.    [provider]  carisoprodol (SOMA) 350 MG tablet Take 1 tablet (350 mg total) by mouth 3 (three) times daily as needed. 07/10/17   Schaevitz, Myra Rude, MD  cetirizine (ZYRTEC) 10 MG tablet Take 1 tablet (10 mg total) by mouth daily. Patient not taking: Reported on 09/07/2016 12/18/15   Cuthriell, Delorise Royals, [name]  diclofenac sodium (VOLTAREN) 1 % GEL Apply topically up to qid to affected area for 10 days. 07/10/17   Schaevitz, Myra Rude, MD  dicyclomine (BENTYL) 10 MG capsule Take 1 capsule (10 mg total) by mouth 3 (three) times daily as needed for up to 5 days (for cramps). 07/25/17 07/30/17  Dionne Bucy, MD    fluticasone (FLONASE) 50 MCG/ACT nasal spray Place 1 spray into both nostrils 2 (two) times daily. Patient not taking: Reported on 09/07/2016 12/18/15   Cuthriell, Delorise Royals, [name]  hydrOXYzine (ATARAX/VISTARIL) 50 MG tablet Take 1 tablet (50 mg total) by mouth 3 (three) times daily as needed. Patient not taking: Reported on 09/07/2016 01/10/16   Joni Reining, [name]  ibuprofen (ADVIL,MOTRIN) 600 MG tablet Take 1 tablet (600 mg total) by mouth every 8 (eight) hours as needed. Patient not taking: Reported on 09/07/2016 06/22/16   Joni Reining, [name]  naproxen (NAPROSYN) 500 MG tablet Take 1 tablet (500 mg total) by mouth 2 (two) times daily with a meal. Patient not taking: Reported on 09/07/2016 08/03/16   Jene Every, MD  ondansetron (ZOFRAN) 4 MG tablet Take 1 tablet (4 mg total) by mouth every 8 (eight) hours as needed for nausea or vomiting. 01/06/17   Jeanmarie Plant, MD  penicillin v potassium (VEETID) 500 MG tablet Take 1 tablet (500 mg total) by mouth 4 (four) times daily. 09/07/16   Phineas Semen, MD  predniSONE (STERAPRED UNI-PAK 21 TAB) 10 MG (21) TBPK tablet Take 6 tablets on day 1, take 5 tablets on day 2, take 4 tablets on day 3, take 3 tablets on day 4, take 2 tablets on day 5, take 1 tablet on day 6 Patient not taking: Reported on 09/07/2016 07/11/16   Enid Derry, [name]  traMADol (ULTRAM) 50 MG tablet Take 1 tablet (50 mg total) by mouth every  6 (six) hours as needed. 09/07/16 09/07/17  Phineas Semen, MD    Allergies Patient has no known allergies.  Family History  Problem Relation Age of Onset  . Asthma Brother     Social History Social History   Tobacco Use  . Smoking status: Former Smoker    Packs/day: 1.00    Years: 10.00    Pack years: 10.00    Types: Cigarettes  . Smokeless tobacco: Never Used  Substance Use Topics  . Alcohol use: Yes    Comment: rare   . Drug use: Yes    Types: Marijuana    Comment: Patient recently quit smoking marjuana in July 2015;  smoked marjuana since high school.    Review of Systems  Constitutional: No fever. Eyes: No redness. ENT: No sore throat. Cardiovascular: Denies chest pain. Respiratory: Denies shortness of breath. Gastrointestinal: No vomiting.  Positive for diarrhea.  Genitourinary: Negative for dysuria.  Musculoskeletal: Negative for back pain. Skin: Negative for rash. Neurological: Negative for headache.   ____________________________________________   PHYSICAL EXAM:  VITAL SIGNS: ED Triage Vitals  Enc Vitals Group     BP 07/25/17 1226 130/67     Pulse Rate 07/25/17 1226 84     Resp 07/25/17 1226 18     Temp 07/25/17 1226 98.3 F (36.8 C)     Temp Source 07/25/17 1226 Oral     SpO2 07/25/17 1226 100 %     Weight 07/25/17 1227 280 lb (127 kg)     Height 07/25/17 1227  (1.905 m)     Head Circumference --      Peak Flow --      Pain Score 07/25/17 1227 6     Pain Loc --      Pain Edu? --      Excl. in GC? --     Constitutional: Alert and oriented. Well appearing and in no acute distress. Eyes: Conjunctivae are normal.  Head: Atraumatic. Nose: No congestion/rhinnorhea. Mouth/Throat: Mucous membranes are moist.   Neck: Normal range of motion.  Cardiovascular:   Good peripheral circulation. Respiratory: Normal respiratory effort.  No retractions. Lungs CTAB. Gastrointestinal: Soft and nontender. No distention.  Genitourinary: No CVA tenderness. Musculoskeletal:Extremities warm and well perfused.  Neurologic:  Normal speech and language. No gross focal neurologic deficits are appreciated.  Skin:  Skin is warm and dry. No rash noted. Psychiatric: Mood and affect are normal. Speech and behavior are normal.  ____________________________________________   LABS (all labs ordered are listed, but only abnormal results are displayed)  Labs Reviewed  LIPASE, BLOOD - Abnormal; Notable for the following components:      Result Value   Lipase 60 (*)    All other components  within normal limits  COMPREHENSIVE METABOLIC PANEL - Abnormal; Notable for the following components:   Glucose, Bld 109 (*)    Total Protein 8.5 (*)    All other components within normal limits  URINALYSIS, COMPLETE (UACMP) WITH MICROSCOPIC - Abnormal; Notable for the following components:   Color, Urine STRAW (*)    APPearance CLEAR (*)    All other components within normal limits  CBC   ____________________________________________  EKG   ____________________________________________  RADIOLOGY    ____________________________________________   PROCEDURES  Procedure(s) performed: No  Procedures  Critical Care performed: No ____________________________________________   INITIAL IMPRESSION / ASSESSMENT AND PLAN / ED COURSE  Pertinent labs & imaging results that were available during my care of the patient were reviewed by me and  considered in my medical decision making (see chart for details).  32 year old male with PMH as noted above presents with diarrhea over the last 3 days which is nonbloody.  It is associated with abdominal cramping but no persistent abdominal pain, vomiting, or fever.  On exam, the patient is well-appearing, vital signs are normal, the abdomen is soft and nontender, mucous membranes are moist, and the remainder the exam is unremarkable.  Labs obtained from triage are within normal limits except for minimally elevated lipase which patient has had on all prior labs in the last several months.  I reviewed the past medical records in epic; the patient has had multiple prior visits over the last year including at least 2 prior visits for diarrhea and abdominal symptoms similar to today's presentation.  Overall presentation is most consistent with viral gastroenteritis.  Given the benign abdominal exam and reassuring labs, there is no indication for imaging.  I will d/c with symptomatic treatment with Bentyl.       ____________________________________________   FINAL CLINICAL IMPRESSION(S) / ED DIAGNOSES  Final diagnoses:  Diarrhea of presumed infectious origin      NEW MEDICATIONS STARTED DURING THIS VISIT:  New Prescriptions   DICYCLOMINE (BENTYL) 10 MG CAPSULE    Take 1 capsule (10 mg total) by mouth 3 (three) times daily as needed for up to 5 days (for cramps).     Note:  This document was prepared using Dragon voice recognition software and may include unintentional dictation errors.    Dionne Bucy, MD 07/25/17 1500

## 2017-07-25 NOTE — ED Triage Notes (Signed)
Diarrhea X 3 days. Reports X4 today. Pt eating [employer] at this time. Pt alert and oriented X4, active, cooperative, pt in NAD. RR even and unlabored, color WNL.  Reports intermittent diaphoresis.

## 2017-08-21 ENCOUNTER — Encounter: Payer: Self-pay | Admitting: Emergency Medicine

## 2017-08-21 ENCOUNTER — Emergency Department
Admission: EM | Admit: 2017-08-21 | Discharge: 2017-08-21 | Disposition: A | Discharge status: Home / Self Care | Payer: Self-pay | Attending: Emergency Medicine | Admitting: Emergency Medicine

## 2017-08-21 DIAGNOSIS — K047 Periapical abscess without sinus: Secondary | ICD-10-CM

## 2017-08-21 DIAGNOSIS — I10 Essential (primary) hypertension: Secondary | ICD-10-CM | POA: Insufficient documentation

## 2017-08-21 DIAGNOSIS — K029 Dental caries, unspecified: Secondary | ICD-10-CM | POA: Insufficient documentation

## 2017-08-21 DIAGNOSIS — Z79899 Other long term (current) drug therapy: Secondary | ICD-10-CM | POA: Insufficient documentation

## 2017-08-21 DIAGNOSIS — R7303 Prediabetes: Secondary | ICD-10-CM | POA: Insufficient documentation

## 2017-08-21 DIAGNOSIS — Z87891 Personal history of nicotine dependence: Secondary | ICD-10-CM | POA: Insufficient documentation

## 2017-08-21 MED ORDER — TRAMADOL HCL 50 MG PO TABS: 50.0000 mg | ORAL_TABLET | Freq: Four times a day (QID) | ORAL | 0 refills | Status: DC | PRN

## 2017-08-21 MED ORDER — AMOXICILLIN 500 MG PO CAPS: 500.0000 mg | ORAL_CAPSULE | Freq: Three times a day (TID) | ORAL | 0 refills | Status: DC

## 2017-08-21 MED ORDER — IBUPROFEN 800 MG PO TABS: 800.0000 mg | ORAL_TABLET | Freq: Three times a day (TID) | ORAL | 0 refills | Status: DC | PRN

## 2017-08-21 NOTE — Discharge Instructions (Addendum)
OPTIONS FOR DENTAL FOLLOW UP CARE ° °[place] Department of Health and Human Services - Local Safety Net Dental Clinics °http://www.ncdhhs.gov/dph/oralhealth/services/safetynetclinics.htm °  °[facility] (336-562-3123) ° °[facility] [place] ([phone]) ° °[facility] ([phone] ext [phone]) ° °[facility] ([phone]) ° °[facility] ([phone]) °This clinic caters to the indigent population and is on a lottery system. °Location: °[facility], [address], [place] °Clinic Hours: °Wednesdays from 6pm - 9pm, patients seen by a lottery system. °For dates, call or go to [email or web address].[facility].edu/[facility]/patients/[email or web address] °Services: °Cleanings, fillings and simple extractions. °Payment Options: °DENTAL WORK IS FREE OF CHARGE. Bring proof of income or support. °Best way to get seen: °Arrive at 5:15 pm - this is a lottery, NOT first come/first serve, so arriving earlier will not increase your chances of being seen. °  °  °[facility] °[phone] °Select option 1 for emergencies °  °Location: °[facility], [address], [place] °Clinic Hours: °No walk-ins accepted - call the day before to schedule an appointment. °Check in times are 9:30 am and 1:30 pm. °Services: °Simple extractions, temporary fillings, pulpectomy/pulp debridement, uncomplicated abscess drainage. °Payment Options: °PAYMENT IS DUE AT THE TIME OF SERVICE.  Fee is usually $100-200, additional surgical procedures (e.g. abscess drainage) may be extra. °Cash, checks, Visa/MasterCard accepted.  Can file Medicaid if patient is covered for dental - patient should call case worker to check. °No discount for [facility] patients. °Best way to get seen: °MUST call the day before and get onto the schedule. Can usually be seen the next 1-2 days. No walk-ins accepted. °  °  °[facility] °[phone] °   °Location: °[facility], [address], [place] °Clinic Hours: °M, W, Th, F 8am or 1:30pm, Tues 9a or 1:30 - first come/first served. °Services: °Simple extractions, temporary fillings, uncomplicated abscess drainage.  You do not need to be an [place] resident. °Payment Options: °PAYMENT IS DUE AT THE TIME OF SERVICE. °Dental insurance, otherwise sliding scale - bring proof of income or support. °Depending on income and treatment needed, cost is usually $50-200. °Best way to get seen: °Arrive early as it is first come/first served. °  °  °[facility] °[phone] °  °Location: °[address] °Clinic Hours: °Mon-Thu 8a-5p °Services: °Most basic dental services including extractions and fillings. °Payment Options: °PAYMENT IS DUE AT THE TIME OF SERVICE. °Sliding scale, up to 50% off - bring proof if income or support. °Medicaid with dental option accepted. °Best way to get seen: °Call to schedule an appointment, can usually be seen within 2 weeks OR they will try to see walk-ins - show up at 8a or 2p (you may have to wait). °  °  °[facility] °919-245-2435 °[place] RESIDENTS ONLY °  °Location: °[facility], [address], [place], [place] [address] °Clinic Hours: By appointment only. °Monday - Thursday 8am-5pm, Friday 8am-12pm °Services: Cleanings, fillings, extractions. °Payment Options: °PAYMENT IS DUE AT THE TIME OF SERVICE. °Cash, Visa or MasterCard. Sliding scale - $30 minimum per service. °Best way to get seen: °Come in to office, complete packet and make an appointment - need proof of income °or support monies for each household member and proof of [place] residence. °Usually takes about a month to get in. °  °  °[facility] °[phone] °  °Location: °[address]   [place] °Clinic Hours: Walk-in Urgent Care Dental Services are offered Monday-Friday  mornings only. °The numbers of emergencies accepted daily is limited to the number of °providers available. °Maximum 15 - Mondays, Wednesdays & Thursdays °Maximum 10 - Tuesdays & Fridays °Services: °You do not need to be a [place] resident to be seen for a dental emergency. °Emergencies are defined as pain, swelling, abnormal bleeding, or dental trauma. Walkins will receive x-rays if needed. °NOTE: Dental cleaning is not an emergency. °Payment Options: °PAYMENT IS DUE AT THE TIME OF SERVICE. °Minimum co-pay is $40.00 for uninsured patients. °Minimum co-pay is $3.00 for Medicaid with dental coverage. °Dental Insurance is accepted and must be presented at time of visit. °Medicare does not cover dental. °Forms of payment: Cash, credit card, checks. °Best way to get seen: °If not previously registered with the clinic, walk-in dental registration begins at 7:15 am and is on a first come/first serve basis. °If previously registered with the clinic, call to make an appointment. °  °  °[facility] °[phone] °[place] RESIDENTS ONLY °  °Location: °[address], [place], [place] °Clinic Hours: °Mon-Thu 10a-2p °Services: Extractions only! °Payment Options: °FREE (donations accepted) - bring proof of income or support °Best way to get seen: °Call and schedule an appointment OR come at 8am on the 1st Monday of every month (except for holidays) when it is first come/first served. °  °  °[facility] °[phone] °  °Location: °[address], [place] °Clinic Hours: °Friday mornings °Services, Payment Options, Best way to get seen: °Call for info °

## 2017-08-21 NOTE — ED Provider Notes (Signed)
[facility] Emergency Department Provider Note  ____________________________________[facility]________   First MD Initiated Contact with Patient 08/21/17 0820     (approximate)  I have reviewed the triage vital signs and the nursing notes.   HISTORY  Chief Complaint Dental Pain    HPI Michael Shaw is a 32 y.o. male presents emergency department with pain and swelling to the right lower gum.  He states he has had these bad teeth for years and it got worse yesterday.  He denies chills.  Chest pain or shortness of breath.  Past Medical History:  Diagnosis Date  . Hypertension   . Prediabetes     Patient Active Problem List   Diagnosis Date Noted  . Bradycardia 11/10/2013  . Elevated blood pressure 11/10/2013    History reviewed. No pertinent surgical history.  Prior to Admission medications   Medication Sig Start Date End Date Taking? Authorizing Provider  amLODipine (NORVASC) 5 MG tablet Take 5 mg by mouth daily.    [provider]  amoxicillin (AMOXIL) 500 MG capsule Take 1 capsule (500 mg total) by mouth 3 (three) times daily. 08/21/17   Braydn Carneiro, Roselyn BeringSusan W, [name]  carisoprodol (SOMA) 350 MG tablet Take 1 tablet (350 mg total) by mouth 3 (three) times daily as needed. 07/10/17   Schaevitz, Myra Rudeavid Matthew, MD  dicyclomine (BENTYL) 10 MG capsule Take 1 capsule (10 mg total) by mouth 3 (three) times daily as needed for up to 5 days (for cramps). 07/25/17 07/30/17  Dionne BucySiadecki, Sebastian, MD  ibuprofen (ADVIL,MOTRIN) 800 MG tablet Take 1 tablet (800 mg total) by mouth every 8 (eight) hours as needed. 08/21/17   Xaiver Roskelley, Roselyn BeringSusan W, [name]  ondansetron (ZOFRAN) 4 MG tablet Take 1 tablet (4 mg total) by mouth every 8 (eight) hours as needed for nausea or vomiting. 01/06/17   Jeanmarie PlantMcShane, James A, MD  traMADol (ULTRAM) 50 MG tablet Take 1 tablet (50 mg total) by mouth every 6 (six) hours as needed. 08/21/17   Faythe GheeFisher, Aylinn Rydberg W, [name]    Allergies Patient has no known  allergies.  Family History  Problem Relation Age of Onset  . Asthma Brother     Social History Social History   Tobacco Use  . Smoking status: Former Smoker    Packs/day: 1.00    Years: 10.00    Pack years: 10.00    Types: Cigarettes  . Smokeless tobacco: Never Used  Substance Use Topics  . Alcohol use: Yes    Comment: rare   . Drug use: Yes    Types: Marijuana    Comment: Patient recently quit smoking marjuana in July 2015; smoked marjuana since high school.    Review of Systems  Constitutional: No fever/chills Eyes: No visual changes. ENT: No sore throat.  As for dental pain and right-sided gum swelling Respiratory: Denies cough Genitourinary: Negative for dysuria. Musculoskeletal: Negative for back pain. Skin: Negative for rash.    ____________________________________________   PHYSICAL EXAM:  VITAL SIGNS: ED Triage Vitals [08/21/17 0723]  Enc Vitals Group     BP 128/80     Pulse Rate 78     Resp 20     Temp 98.2 F (36.8 C)     Temp Source Oral     SpO2 98 %     Weight 275 lb (124.7 kg)     Height 6\' 3"  (1.905 m)     Head Circumference      Peak Flow      Pain Score  8     Pain Loc      Pain Edu?      Excl. in GC?     Constitutional: Alert and oriented. Well appearing and in no acute distress. Eyes: Conjunctivae are normal.  Head: Atraumatic. Nose: No congestion/rhinnorhea. Mouth/Throat: Mucous membranes are moist.  Positive for severely decayed right lower molars.  They are broken at the gumline.  There is minimal swelling if any noted. Neck: Is supple, no lymphadenopathy is noted Cardiovascular: Normal rate, regular rhythm.  Heart sounds are normal Respiratory: Normal respiratory effort.  No retractions, lungs clear to all station GU: deferred Musculoskeletal: FROM all extremities, warm and well perfused Neurologic:  Normal speech and language.  Skin:  Skin is warm, dry and intact. No rash noted. Psychiatric: Mood and affect are normal.  Speech and behavior are normal.  ____________________________________________   LABS (all labs ordered are listed, but only abnormal results are displayed)  Labs Reviewed - No data to display ____________________________________________   ____________________________________________  RADIOLOGY    ____________________________________________   PROCEDURES  Procedure(s) performed: No  Procedures    ____________________________________________   INITIAL IMPRESSION / ASSESSMENT AND PLAN / ED COURSE  Pertinent labs & imaging results that were available during my care of the patient were reviewed by me and considered in my medical decision making (see chart for details).  Patient is 32 year old male presents emergency department complaining of right sided tooth pain and swelling.  Physical exam the patient has a severely decayed right lower molar.  The tooth is broken at the gumline.  There is minimal swelling if any.  If no lymphadenopathy is noted.  Remainder the exam is unremarkable  Explained the findings to the patient.  He was given a prescription for amoxicillin 500 mg 3 times a day, ibuprofen 800 mg 3 times a day, and tramadol 50 mg #15 with no refill.  He was also given a list of dental clinics that he is to follow-up with.  He states he understands comply with instructions.  Was discharged in stable condition     As part of my medical decision making, I reviewed the following data within the electronic MEDICAL RECORD NUMBER Nursing notes reviewed and incorporated, Old chart reviewed, Notes from prior ED visits and [place] Controlled Substance Database  ____________________________________________   FINAL CLINICAL IMPRESSION(S) / ED DIAGNOSES  Final diagnoses:  Dental abscess  Pain due to dental caries      NEW MEDICATIONS STARTED DURING THIS VISIT:  Discharge Medication List as of 08/21/2017  9:12 AM    START taking these medications   Details  amoxicillin  (AMOXIL) 500 MG capsule Take 1 capsule (500 mg total) by mouth 3 (three) times daily., Starting Tue 08/21/2017, Print         Note:  This document was prepared using Dragon voice recognition software and may include unintentional dictation errors.    Faythe Ghee, [name] 08/21/17 1510    Nita Sickle, MD 08/22/17 1258

## 2017-08-21 NOTE — ED Triage Notes (Signed)
Pt reports toothache to the right lower for a bit but yesterday the pain got worse. Denies fevers or other sx's.

## 2017-08-21 NOTE — ED Notes (Signed)
See triage note   Presents with pain and swelling to right lower gumline  Denies any trauma or injury to tooth

## 2017-09-11 ENCOUNTER — Other Ambulatory Visit: Payer: Self-pay | Admitting: Family Medicine

## 2017-09-11 DIAGNOSIS — K7689 Other specified diseases of liver: Secondary | ICD-10-CM

## 2017-09-11 DIAGNOSIS — R748 Abnormal levels of other serum enzymes: Secondary | ICD-10-CM

## 2017-09-19 ENCOUNTER — Ambulatory Visit: Admission: RE | Admit: 2017-09-19 | Payer: Self-pay | Source: Ambulatory Visit

## 2018-01-06 ENCOUNTER — Encounter: Payer: Self-pay | Admitting: Emergency Medicine

## 2018-01-06 ENCOUNTER — Other Ambulatory Visit: Payer: Self-pay

## 2018-01-06 ENCOUNTER — Emergency Department
Admission: EM | Admit: 2018-01-06 | Discharge: 2018-01-06 | Disposition: A | Discharge status: Home / Self Care | Payer: Self-pay | Attending: Emergency Medicine | Admitting: Emergency Medicine

## 2018-01-06 DIAGNOSIS — R102 Pelvic and perineal pain: Secondary | ICD-10-CM | POA: Insufficient documentation

## 2018-01-06 DIAGNOSIS — Z5321 Procedure and treatment not carried out due to patient leaving prior to being seen by health care provider: Secondary | ICD-10-CM | POA: Insufficient documentation

## 2018-01-06 LAB — URINALYSIS, COMPLETE (UACMP) WITH MICROSCOPIC
Bacteria, UA: NONE SEEN
Bilirubin Urine: NEGATIVE
GLUCOSE, UA: NEGATIVE mg/dL
Hgb urine dipstick: NEGATIVE
Ketones, ur: NEGATIVE mg/dL
Leukocytes, UA: NEGATIVE
NITRITE: NEGATIVE
PH: 5 (ref 5.0–8.0)
Protein, ur: NEGATIVE mg/dL
SPECIFIC GRAVITY, URINE: 1.016 (ref 1.005–1.030)
Squamous Epithelial / LPF: NONE SEEN (ref 0–5)

## 2018-01-06 LAB — CBC
HEMATOCRIT: 44.6 % (ref 39.0–52.0)
Hemoglobin: 13.9 g/dL (ref 13.0–17.0)
MCH: 27.6 pg (ref 26.0–34.0)
MCHC: 31.2 g/dL (ref 30.0–36.0)
MCV: 88.5 fL (ref 80.0–100.0)
Platelets: 282 10*3/uL (ref 150–400)
RBC: 5.04 MIL/uL (ref 4.22–5.81)
RDW: 13.2 % (ref 11.5–15.5)
WBC: 7.1 10*3/uL (ref 4.0–10.5)
nRBC: 0 % (ref 0.0–0.2)

## 2018-01-06 LAB — COMPREHENSIVE METABOLIC PANEL
ALBUMIN: 4.2 g/dL (ref 3.5–5.0)
ALK PHOS: 75 U/L (ref 38–126)
ALT: 31 U/L (ref 0–44)
AST: 22 U/L (ref 15–41)
Anion gap: 6 (ref 5–15)
BILIRUBIN TOTAL: 0.4 mg/dL (ref 0.3–1.2)
BUN: 12 mg/dL (ref 6–20)
CHLORIDE: 108 mmol/L (ref 98–111)
CO2: 26 mmol/L (ref 22–32)
CREATININE: 1 mg/dL (ref 0.61–1.24)
Calcium: 9.2 mg/dL (ref 8.9–10.3)
GFR calc Af Amer: >60 mL/min (ref >60)
GFR calc non Af Amer: >60 mL/min (ref >60)
Glucose, Bld: 104 mg/dL — ABNORMAL HIGH (ref 70–99)
Potassium: 4 mmol/L (ref 3.5–5.1)
SODIUM: 140 mmol/L (ref 135–145)
Total Protein: 8 g/dL (ref 6.5–8.1)

## 2018-01-06 LAB — LIPASE, BLOOD: Lipase: 135 U/L — ABNORMAL HIGH (ref 11–51)

## 2018-01-06 NOTE — ED Triage Notes (Signed)
Pt to ED via POV c/o Bilateral lower abdomen pain/ pelvic pain. PT states that he has had pain x 2-3 days. Pt denies fever, chills, or dysuria. Pt is in NAD at this time.

## 2018-01-08 ENCOUNTER — Telehealth: Payer: Self-pay | Admitting: Emergency Medicine

## 2018-01-08 NOTE — Telephone Encounter (Signed)
Called patient due to lwot to inquire about condition and follow up plans. Pt not home.  fam member says he is doing okay now

## 2018-01-19 ENCOUNTER — Encounter: Payer: Self-pay | Admitting: Emergency Medicine

## 2018-01-19 ENCOUNTER — Other Ambulatory Visit: Payer: Self-pay

## 2018-01-19 ENCOUNTER — Emergency Department
Admission: EM | Admit: 2018-01-19 | Discharge: 2018-01-19 | Disposition: A | Discharge status: Home / Self Care | Payer: Self-pay | Attending: Emergency Medicine | Admitting: Emergency Medicine

## 2018-01-19 DIAGNOSIS — H8111 Benign paroxysmal vertigo, right ear: Secondary | ICD-10-CM | POA: Insufficient documentation

## 2018-01-19 DIAGNOSIS — Z79899 Other long term (current) drug therapy: Secondary | ICD-10-CM | POA: Insufficient documentation

## 2018-01-19 DIAGNOSIS — Z87891 Personal history of nicotine dependence: Secondary | ICD-10-CM | POA: Insufficient documentation

## 2018-01-19 DIAGNOSIS — I10 Essential (primary) hypertension: Secondary | ICD-10-CM | POA: Insufficient documentation

## 2018-01-19 MED ORDER — MECLIZINE HCL 25 MG PO TABS: 25.0000 mg | ORAL_TABLET | Freq: Three times a day (TID) | ORAL | 0 refills | Status: DC | PRN

## 2018-01-19 NOTE — Discharge Instructions (Addendum)
Drink plenty of water.  Take medication as prescribed.  Return emergency department if worsening.

## 2018-01-19 NOTE — ED Provider Notes (Signed)
[facility] Emergency Department Provider Note  ____________________________________________   First MD Initiated Contact with Patient 01/19/18 1633     (approximate)  I have reviewed the triage vital signs and the nursing notes.   HISTORY  Chief Complaint Sore Throat    HPI Bayan L Friedland is a 32 y.o. male since emergency department complaining of feeling woozy for 3 days.  He denies cough, congestion, fever, chills, chest pain, shortness of breath, or sore throat.  States he is just felt, we can stay at home.  Thought maybe he had the flu.    Past Medical History:  Diagnosis Date  . Hypertension   . Prediabetes     Patient Active Problem List   Diagnosis Date Noted  . Bradycardia 11/10/2013  . Elevated blood pressure 11/10/2013    History reviewed. No pertinent surgical history.  Prior to Admission medications   Medication Sig Start Date End Date Taking? Authorizing Provider  amLODipine (NORVASC) 5 MG tablet Take 5 mg by mouth daily.    [provider]  amoxicillin (AMOXIL) 500 MG capsule Take 1 capsule (500 mg total) by mouth 3 (three) times daily. 08/21/17   Anthony Roland, Roselyn Bering, [name]  carisoprodol (SOMA) 350 MG tablet Take 1 tablet (350 mg total) by mouth 3 (three) times daily as needed. 07/10/17   Schaevitz, Myra Rude, MD  dicyclomine (BENTYL) 10 MG capsule Take 1 capsule (10 mg total) by mouth 3 (three) times daily as needed for up to 5 days (for cramps). 07/25/17 07/30/17  Dionne Bucy, MD  ibuprofen (ADVIL,MOTRIN) 800 MG tablet Take 1 tablet (800 mg total) by mouth every 8 (eight) hours as needed. 08/21/17   Freemon Binford, Roselyn Bering, [name]  meclizine (ANTIVERT) 25 MG tablet Take 1 tablet (25 mg total) by mouth 3 (three) times daily as needed for dizziness. 01/19/18   Archer Moist, Roselyn Bering, [name]  ondansetron (ZOFRAN) 4 MG tablet Take 1 tablet (4 mg total) by mouth every 8 (eight) hours as needed for nausea or vomiting. 01/06/17   Jeanmarie Plant, MD  traMADol (ULTRAM) 50 MG tablet Take 1 tablet (50 mg total) by mouth every 6 (six) hours as needed. 08/21/17   Faythe Ghee, [name]    Allergies Patient has no known allergies.  Family History  Problem Relation Age of Onset  . Asthma Brother     Social History Social History   Tobacco Use  . Smoking status: Former Smoker    Packs/day: 1.00    Years: 10.00    Pack years: 10.00    Types: Cigarettes  . Smokeless tobacco: Never Used  Substance Use Topics  . Alcohol use: Yes    Comment: rare   . Drug use: Yes    Types: Marijuana    Comment: Patient recently quit smoking marjuana in July 2015; smoked marjuana since high school.    Review of Systems  Constitutional: No fever/chills Eyes: No visual changes. ENT: No sore throat.  Felt woozy Respiratory: Denies cough Genitourinary: Negative for dysuria. Musculoskeletal: Negative for back pain. Skin: Negative for rash.    ____________________________________________   PHYSICAL EXAM:  VITAL SIGNS: ED Triage Vitals  Enc Vitals Group     BP 01/19/18 1611 136/87     Pulse Rate 01/19/18 1611 92     Resp 01/19/18 1611 18     Temp 01/19/18 1611 98.5 F (36.9 C)     Temp Source 01/19/18 1611 Oral     SpO2 01/19/18 1611 98 %  Weight 01/19/18 1613 280 lb (127 kg)     Height 01/19/18 1613 6\' 3"  (1.905 m)     Head Circumference --      Peak Flow --      Pain Score 01/19/18 1613 7     Pain Loc --      Pain Edu? --      Excl. in GC? --     Constitutional: Alert and oriented. Well appearing and in no acute distress. Eyes: Conjunctivae are normal.  4 beats nystagmus on the right. Head: Atraumatic. Ears TMs are dull bilaterally Nose: No congestion/rhinnorhea. Mouth/Throat: Mucous membranes are moist.   Neck:  supple no lymphadenopathy noted Cardiovascular: Normal rate, regular rhythm. Heart sounds are normal Respiratory: Normal respiratory effort.  No retractions, lungs c t a  GU: deferred Musculoskeletal:  FROM all extremities, warm and well perfused Neurologic:  Normal speech and language.  Skin:  Skin is warm, dry and intact. No rash noted. Psychiatric: Mood and affect are normal. Speech and behavior are normal.  ____________________________________________   LABS (all labs ordered are listed, but only abnormal results are displayed)  Labs Reviewed - No data to display ____________________________________________   ____________________________________________  RADIOLOGY    ____________________________________________   PROCEDURES  Procedure(s) performed: No  Procedures    ____________________________________________   INITIAL IMPRESSION / ASSESSMENT AND PLAN / ED COURSE  Pertinent labs & imaging results that were available during my care of the patient were reviewed by me and considered in my medical decision making (see chart for details).   Patient is 32 year old male presents emergency department complaining of feeling "woozy".  He has no other symptoms.  He is afraid he has the flu although he has not had a fever.  He states he does feel better than he felt 2 days ago.  On physical exam patient appears very well.  The only significant finding on exam is 4 beats of nystagmus on the right side.  Explained findings to the patient.  Explained to him he does not have fluids he is not had a fever, cough, congestion, or any other flulike symptoms.  Explained to him that he could feel a little "woozy" due to vertigo.  He was given a prescription for meclizine.  He is to drink plenty of water.  Return emergency department if worsening.  States he understands will comply.  Was discharged stable condition     As part of my medical decision making, I reviewed the following data within the electronic MEDICAL RECORD NUMBER Nursing notes reviewed and incorporated, Old chart reviewed, Notes from prior ED visits and [place] Controlled Substance  Database  ____________________________________________   FINAL CLINICAL IMPRESSION(S) / ED DIAGNOSES  Final diagnoses:  Vertigo, benign paroxysmal, right      NEW MEDICATIONS STARTED DURING THIS VISIT:  New Prescriptions   MECLIZINE (ANTIVERT) 25 MG TABLET    Take 1 tablet (25 mg total) by mouth 3 (three) times daily as needed for dizziness.     Note:  This document was prepared using Dragon voice recognition software and may include unintentional dictation errors.    Faythe Ghee, [name] 01/19/18 1650    Jeanmarie Plant, MD 01/19/18 Ernestina [place]

## 2018-01-19 NOTE — ED Triage Notes (Signed)
Sore throat and malaise x 3 days.

## 2018-03-28 ENCOUNTER — Emergency Department: Payer: Self-pay

## 2018-03-28 ENCOUNTER — Emergency Department
Admission: EM | Admit: 2018-03-28 | Discharge: 2018-03-28 | Disposition: A | Discharge status: Home / Self Care | Payer: Self-pay | Attending: Emergency Medicine | Admitting: Emergency Medicine

## 2018-03-28 ENCOUNTER — Other Ambulatory Visit: Payer: Self-pay

## 2018-03-28 DIAGNOSIS — Z79899 Other long term (current) drug therapy: Secondary | ICD-10-CM | POA: Insufficient documentation

## 2018-03-28 DIAGNOSIS — Z87891 Personal history of nicotine dependence: Secondary | ICD-10-CM | POA: Insufficient documentation

## 2018-03-28 DIAGNOSIS — J111 Influenza due to unidentified influenza virus with other respiratory manifestations: Secondary | ICD-10-CM | POA: Insufficient documentation

## 2018-03-28 DIAGNOSIS — R69 Illness, unspecified: Secondary | ICD-10-CM

## 2018-03-28 DIAGNOSIS — K047 Periapical abscess without sinus: Secondary | ICD-10-CM | POA: Insufficient documentation

## 2018-03-28 DIAGNOSIS — I10 Essential (primary) hypertension: Secondary | ICD-10-CM | POA: Insufficient documentation

## 2018-03-28 LAB — INFLUENZA PANEL BY PCR (TYPE A & B)
INFLBPCR: NEGATIVE
Influenza A By PCR: NEGATIVE

## 2018-03-28 LAB — GROUP A STREP BY PCR: Group A Strep by PCR: NOT DETECTED

## 2018-03-28 MED ORDER — FLUTICASONE PROPIONATE 50 MCG/ACT NA SUSP: 2.0000 | Freq: Every day | NASAL | 0 refills | Status: DC

## 2018-03-28 MED ORDER — LIDOCAINE VISCOUS HCL 2 % MT SOLN: 10.0000 mL | OROMUCOSAL | 0 refills | Status: DC | PRN

## 2018-03-28 MED ORDER — IBUPROFEN 600 MG PO TABS: 600.0000 mg | ORAL_TABLET | Freq: Four times a day (QID) | ORAL | 0 refills | Status: DC | PRN

## 2018-03-28 MED ORDER — ACETAMINOPHEN 500 MG PO TABS: 500.0000 mg | ORAL_TABLET | Freq: Four times a day (QID) | ORAL | 0 refills | Status: DC | PRN

## 2018-03-28 MED ORDER — AMOXICILLIN 500 MG PO CAPS: 500.0000 mg | ORAL_CAPSULE | Freq: Three times a day (TID) | ORAL | 0 refills | Status: DC

## 2018-03-28 NOTE — ED Triage Notes (Signed)
Pt c/o flu symptoms x3 days - exposed to daughter that has been dx with the flu Cold chills, sweating weak, sore throat, body aches

## 2018-03-28 NOTE — ED Provider Notes (Signed)
[facility] Emergency Department Provider Note  ____________________________________________  Time seen: Approximately 5:47 PM  I have reviewed the triage vital signs and the nursing notes.   HISTORY  Chief Complaint Influenza    HPI Michael Shaw is a 33 y.o. male that presents to the emergency department for evaluation of night sweats, sore throat, nasal congestion, productive cough with mucus for 2-3 days.  Patient states that he was taking cough drops and DayQuil and thought he was starting to feel better but felt worse this morning.  His daughter was diagnosed with the flu this week.  He smokes "sometimes." He is drinking fluids well.  No shortness of breath, chest pain, vomiting, diarrhea.  Past Medical History:  Diagnosis Date  . Hypertension   . Prediabetes     Patient Active Problem List   Diagnosis Date Noted  . Bradycardia 11/10/2013  . Elevated blood pressure 11/10/2013    History reviewed. No pertinent surgical history.  Prior to Admission medications   Medication Sig Start Date End Date Taking? Authorizing Provider  acetaminophen (TYLENOL) 500 MG tablet Take 1 tablet (500 mg total) by mouth every 6 (six) hours as needed. 03/28/18   Enid Derry, [name]  amLODipine (NORVASC) 5 MG tablet Take 5 mg by mouth daily.    [provider]  amoxicillin (AMOXIL) 500 MG capsule Take 1 capsule (500 mg total) by mouth 3 (three) times daily. 03/28/18   Enid Derry, [name]  carisoprodol (SOMA) 350 MG tablet Take 1 tablet (350 mg total) by mouth 3 (three) times daily as needed. 07/10/17   Schaevitz, Myra Rude, MD  dicyclomine (BENTYL) 10 MG capsule Take 1 capsule (10 mg total) by mouth 3 (three) times daily as needed for up to 5 days (for cramps). 07/25/17 07/30/17  Dionne Bucy, MD  fluticasone (FLONASE) 50 MCG/ACT nasal spray Place 2 sprays into both nostrils daily. 03/28/18 03/28/19  Enid Derry, [name]  ibuprofen (ADVIL,MOTRIN) 600 MG  tablet Take 1 tablet (600 mg total) by mouth every 6 (six) hours as needed. 03/28/18   Enid Derry, [name]  lidocaine (XYLOCAINE) 2 % solution Use as directed 10 mLs in the mouth or throat as needed for mouth pain. 03/28/18   Enid Derry, [name]  meclizine (ANTIVERT) 25 MG tablet Take 1 tablet (25 mg total) by mouth 3 (three) times daily as needed for dizziness. 01/19/18   Fisher, Roselyn Bering, [name]  ondansetron (ZOFRAN) 4 MG tablet Take 1 tablet (4 mg total) by mouth every 8 (eight) hours as needed for nausea or vomiting. 01/06/17   Jeanmarie Plant, MD  traMADol (ULTRAM) 50 MG tablet Take 1 tablet (50 mg total) by mouth every 6 (six) hours as needed. 08/21/17   Faythe Ghee, [name]    Allergies Patient has no known allergies.  Family History  Problem Relation Age of Onset  . Asthma Brother     Social History Social History   Tobacco Use  . Smoking status: Former Smoker    Packs/day: 1.00    Years: 10.00    Pack years: 10.00    Types: Cigarettes  . Smokeless tobacco: Never Used  Substance Use Topics  . Alcohol use: Yes    Comment: rare   . Drug use: Yes    Types: Marijuana    Comment: last used 3 days ago     Review of Systems  Constitutional: Positive for chills Eyes: No visual changes. No discharge. ENT: Positive for congestion and rhinorrhea. Cardiovascular: No chest  pain. Respiratory: Positive for cough. No SOB. Gastrointestinal: No nausea, no vomiting.  No diarrhea.  No constipation. Musculoskeletal: Positive for body aches. Skin: Negative for rash, abrasions, lacerations, ecchymosis. Neurological: Negative for headaches.   ____________________________________________   PHYSICAL EXAM:  VITAL SIGNS: ED Triage Vitals  Enc Vitals Group     BP 03/28/18 1613 (!) 140/99     Pulse Rate 03/28/18 1613 (!) 103     Resp 03/28/18 1613 14     Temp 03/28/18 1613 98.2 F (36.8 C)     Temp Source 03/28/18 1613 Oral     SpO2 03/28/18 1613 100 %     Weight 03/28/18 1614 288  lb (130.6 kg)     Height 03/28/18 1614 6\' 3"  (1.905 m)     Head Circumference --      Peak Flow --      Pain Score 03/28/18 1613 7     Pain Loc --      Pain Edu? --      Excl. in GC? --      Constitutional: Alert and oriented. Well appearing and in no acute distress. Eyes: Conjunctivae are normal. PERRL. EOMI. No discharge. Head: Atraumatic. ENT: No frontal and maxillary sinus tenderness.      Ears: Tympanic membranes pearly gray with good landmarks. No discharge.      Nose: Mild congestion/rhinnorhea.      Mouth/Throat: Mucous membranes are moist. Oropharynx non-erythematous. Tonsils not enlarged. No exudates. Uvula midline. Neck: No stridor.   Hematological/Lymphatic/Immunilogical: No cervical lymphadenopathy. Cardiovascular: Normal rate, regular rhythm.  Good peripheral circulation. Respiratory: Normal respiratory effort without tachypnea or retractions. Lungs CTAB. Good air entry to the bases with no decreased or absent breath sounds. Gastrointestinal: Bowel sounds 4 quadrants. Soft and nontender to palpation. No guarding or rigidity. No palpable masses. No distention. Musculoskeletal: Full range of motion to all extremities. No gross deformities appreciated. Neurologic:  Normal speech and language. No gross focal neurologic deficits are appreciated.  Skin:  Skin is warm, dry and intact. No rash noted. Psychiatric: Mood and affect are normal. Speech and behavior are normal. Patient exhibits appropriate insight and judgement.   ____________________________________________   LABS (all labs ordered are listed, but only abnormal results are displayed)  Labs Reviewed  GROUP A STREP BY PCR  INFLUENZA PANEL BY PCR (TYPE A & B)   ____________________________________________  EKG   ____________________________________________  RADIOLOGY Lexine BatonI, Mariha Sleeper, personally viewed and evaluated these images (plain radiographs) as part of my medical decision making, as well as  reviewing the written report by the radiologist.  Dg Chest 2 View  Result Date: 03/28/2018 CLINICAL DATA:  Three-day history of fever, chills, diaphoresis, generalized weakness, sore throat and myalgias. Daughter recently diagnosed with influenza. EXAM: CHEST - 2 VIEW COMPARISON:  10/20/2016 and earlier. FINDINGS: Cardiomediastinal silhouette unremarkable and unchanged. Lungs clear. Bronchovascular markings normal. Pulmonary vascularity normal. No visible pleural effusions. No pneumothorax. Visualized bony thorax intact. No interval change. IMPRESSION: No acute cardiopulmonary disease. Stable examination. Electronically Signed   By: Hulan Saashomas  Lawrence M.D.   On: 03/28/2018 19:14    ____________________________________________    PROCEDURES  Procedure(s) performed:    Procedures    Medications - No data to display   ____________________________________________   INITIAL IMPRESSION / ASSESSMENT AND PLAN / ED COURSE  Pertinent labs & imaging results that were available during my care of the patient were reviewed by me and considered in my medical decision making (see chart for details).  Review of  the [place] CSRS was performed in accordance of the [facility] prior to dispensing any controlled drugs.     Patient's diagnosis is consistent with influenza-like illness and dental infection. Vital signs and exam are reassuring.  Chest x-ray negative for acute cardiopulmonary processes.  Influenza and strep are negative.  Just prior to discharge, patient states that he has had a tooth to the right top that has been painful with surrounding swelling for the last 2 days as well.  Patient will be covered for dental infection.  Patient appears well and is staying well hydrated. Patient should alternate tylenol and ibuprofen for fever. Patient feels comfortable going home.  Blood pressure is elevated in the emergency department, which is likely due to pain and that he has been taking cough medicine today.   Patient will be discharged home with prescriptions for amoxicillin, viscous lidocaine, Flonase, Tylenol, Motrin. Patient is to follow up with primary care as needed or otherwise directed. Patient is given ED precautions to return to the ED for any worsening or new symptoms.     ____________________________________________  FINAL CLINICAL IMPRESSION(S) / ED DIAGNOSES  Final diagnoses:  Influenza-like illness  Dental abscess      NEW MEDICATIONS STARTED DURING THIS VISIT:  ED Discharge Orders         Ordered    amoxicillin (AMOXIL) 500 MG capsule  3 times daily     03/28/18 1939    lidocaine (XYLOCAINE) 2 % solution  As needed     03/28/18 1939    fluticasone (FLONASE) 50 MCG/ACT nasal spray  Daily     03/28/18 1939    acetaminophen (TYLENOL) 500 MG tablet  Every 6 hours PRN     03/28/18 1939    ibuprofen (ADVIL,MOTRIN) 600 MG tablet  Every 6 hours PRN     03/28/18 1939              This chart was dictated using voice recognition software/Dragon. Despite best efforts to proofread, errors can occur which can change the meaning. Any change was purely unintentional.    Enid Derry, [name] 03/28/18 2225    Minna Antis, MD 03/28/18 2244

## 2019-02-11 ENCOUNTER — Ambulatory Visit
Admission: EM | Admit: 2019-02-11 | Discharge: 2019-02-11 | Disposition: A | Discharge status: Home / Self Care | Payer: Self-pay | Attending: Emergency Medicine | Admitting: Emergency Medicine

## 2019-02-11 ENCOUNTER — Encounter: Payer: Self-pay | Admitting: Emergency Medicine

## 2019-02-11 ENCOUNTER — Other Ambulatory Visit: Payer: Self-pay

## 2019-02-11 DIAGNOSIS — K0889 Other specified disorders of teeth and supporting structures: Secondary | ICD-10-CM

## 2019-02-11 MED ORDER — PENICILLIN V POTASSIUM 500 MG PO TABS: 500.0000 mg | ORAL_TABLET | Freq: Four times a day (QID) | ORAL | 0 refills | Status: DC

## 2019-02-11 NOTE — ED Triage Notes (Signed)
Patient c/o dental abscess that started last week. States his pain is on the right lower side of his mouth.

## 2019-02-11 NOTE — Discharge Instructions (Signed)
Take medication as prescribed. Rest. Drink plenty of fluids. See Dentist as soon as possible. Tylenol and ibuprofen.   Follow up with your primary care physician this week as needed. Return to Urgent care for new or worsening concerns.   OPTIONS FOR DENTAL FOLLOW UP CARE  [place] Department of Health and Human Services - Local Safety Net Dental Clinics [email or web address]   [facility] [phone])  [facility] [phone])  [place] [phone])  [facility] [phone])  [facility] [phone]) This clinic caters to the indigent population and is on a lottery system. Location: [employer] [facility], [employer], [address], [place] Clinic Hours: Wednesdays from 6pm - 9pm, patients seen by a lottery system. For dates, call or go to [email or web address] Services: Cleanings, fillings and simple extractions. Payment Options: DENTAL WORK IS FREE OF CHARGE. Bring proof of income or support. Best way to get seen: Arrive at 5:15 pm - this is a lottery, NOT first come/first serve, so arriving earlier will not increase your chances of being seen.     [facility] [phone] Select option 1 for emergencies   Location: [facility], [place], [address] [place] Clinic Hours: No walk-ins accepted - call the day before to schedule an appointment. Check in times are 9:30 am and 1:30 pm. Services: Simple extractions, temporary fillings, pulpectomy/pulp debridement, uncomplicated abscess drainage. Payment Options: PAYMENT IS DUE AT THE TIME OF SERVICE.  Fee is usually $100-200, additional surgical procedures (e.g. abscess drainage) may be extra. Cash, checks, Visa/MasterCard accepted.  Can file Medicaid if patient is covered for dental - patient should call case worker to  check. No discount for [facility] patients. Best way to get seen: MUST call the day before and get onto the schedule. Can usually be seen the next 1-2 days. No walk-ins accepted.     [facility] [phone]   Location: [facility], [address] [place] [facility] Hours: M, W, Th, F 8am or 1:30pm, Tues 9a or 1:30 - first come/first served. Services: Simple extractions, temporary fillings, uncomplicated abscess drainage.  You do not need to be an [facility] resident. Payment Options: PAYMENT IS DUE AT THE TIME OF SERVICE. Dental insurance, otherwise sliding scale - bring proof of income or support. Depending on income and treatment needed, cost is usually $50-200. Best way to get seen: Arrive early as it is first come/first served.     [facility] [phone]   Location: [address] Clinic Hours: Mon-Thu 8a-5p Services: Most basic dental services including extractions and fillings. Payment Options: PAYMENT IS DUE AT THE TIME OF SERVICE. Sliding scale, up to 50% off - bring proof if income or support. Medicaid with dental option accepted. Best way to get seen: Call to schedule an appointment, can usually be seen within 2 weeks OR they will try to see walk-ins - show up at 8a or 2p (you may have to wait).     [facility] [phone] [place] RESIDENTS ONLY   Location: [facility], [address], [place], [place] [address] Clinic Hours: By appointment only. Monday - Thursday 8am-5pm, Friday 8am-12pm Services: Cleanings, fillings, extractions. Payment Options: PAYMENT IS DUE AT THE TIME OF SERVICE. Cash, Visa or MasterCard. Sliding scale - $30 minimum per service. Best way to get seen: Come in to office, complete packet and make an appointment -  need proof of income or support monies for each household member and proof of [facility]  residence. Usually takes about a month to get in.     [facility] [phone]   Location: [address] [place] [facility] Hours: Walk-in Urgent Care Dental Services are offered Monday-Friday mornings only. The numbers of emergencies accepted daily is limited to the number of providers available. Maximum 15 - Mondays, Wednesdays & Thursdays Maximum 10 - Tuesdays & Fridays Services: You do not need to be a [facility] resident to be seen for a dental emergency. Emergencies are defined as pain, swelling, abnormal bleeding, or dental trauma. Walkins will receive x-rays if needed. NOTE: Dental cleaning is not an emergency. Payment Options: PAYMENT IS DUE AT THE TIME OF SERVICE. Minimum co-pay is $40.00 for uninsured patients. Minimum co-pay is $3.00 for Medicaid with dental coverage. Dental Insurance is accepted and must be presented at time of visit. Medicare does not cover dental. Forms of payment: Cash, credit card, checks. Best way to get seen: If not previously registered with the clinic, walk-in dental registration begins at 7:15 am and is on a first come/first serve basis. If previously registered with the clinic, call to make an appointment.     [facility] [place] ONLY   Location: [address], [place], [place] Clinic Hours: Mon-Thu 10a-2p Services: Extractions only! Payment Options: FREE (donations accepted) - bring proof of income or support Best way to get seen: Call and schedule an appointment OR come at 8am on the 1st Monday of every month (except for holidays) when it is first come/first served.     [facility] [phone]   Location: [place], [place] [facility] Hours: Friday mornings Services, Payment Options, Best way to get seen: Call for info

## 2019-02-11 NOTE — ED Provider Notes (Signed)
[name] ____________________________________________  Time seen: Approximately 2:56 PM  I have reviewed the triage vital signs and the nursing notes.   HISTORY  Chief Complaint Dental Pain    HPI Michael Shaw is a 33 y.o. male presenting for evaluation of right lower dental pain and swelling.  Reports this is been worsening the last few days.  History really of similar, but states usually it will go away without having to come in.  States he has multiple broken teeth in that area and he needs to have them pulled, awaiting finances to have this done.  Has continued to eat and drink well.  Denies fevers, cough, sore throat, recent sickness.  No recent antibiotic use.  Has been cleaning with peroxide.  Denies other alleviating measures.  Did take some ibuprofen without resolution.    Past Medical History:  Diagnosis Date  . Hypertension   . Prediabetes     Patient Active Problem List   Diagnosis Date Noted  . Bradycardia 11/10/2013  . Elevated blood pressure 11/10/2013    History reviewed. No pertinent surgical history.  Current Outpatient Rx  . Order #: [id number] Class: Historical Med  . Order #: [id number] Class: Print  . Order #: [id number] Class: Normal    Allergies Patient has no known allergies.  Family History  Problem Relation Age of Onset  . Asthma Brother     Social History Social History   Tobacco Use  . Smoking status: Former Smoker    Packs/day: 1.00    Years: 10.00    Pack years: 10.00    Types: Cigarettes  . Smokeless tobacco: Never Used  Substance Use Topics  . Alcohol use: Not Currently    Comment: rare   . Drug use: Yes    Types: Marijuana    Comment: last used last week    Review of Systems Constitutional: No fever/chills. Reports continues to eat and drink foods and fluids well.  Eyes: No visual changes. ENT: No sore throat.  Positive dental pain. Cardiovascular: Denies chest pain. Respiratory: Denies shortness of  breath. Gastrointestinal: No abdominal pain.  No nausea, no vomiting.  Genitourinary: Negative for dysuria. Musculoskeletal: Negative for back pain. Skin: Negative for rash.   ____________________________________________   PHYSICAL EXAM:  VITAL SIGNS: ED Triage Vitals  Enc Vitals Group     BP 02/11/19 1401 (!) 127/92     Pulse Rate 02/11/19 1401 77     Resp 02/11/19 1401 18     Temp 02/11/19 1401 98 F (36.7 C)     Temp Source 02/11/19 1401 Oral     SpO2 02/11/19 1401 100 %     Weight 02/11/19 1359 280 lb (127 kg)     Height 02/11/19 1359 6\' 3"  (1.905 m)     Head Circumference --      Peak Flow --      Pain Score 02/11/19 1359 10     Pain Loc --      Pain Edu? --      Excl. in [place]? --     Constitutional: Alert and oriented. Well appearing and in no acute distress. Eyes: Conjunctivae are normal. Head: Atraumatic.  No facial edema noted. Nose: No congestion Mouth/Throat: Mucous membranes are moist.  Oropharynx non-erythematous. Periodontal Exam    Widespread dental decay with multiple fractured teeth right lower 29 through 30 with partially fractured tooth #29 with mild gumline beneath with swelling and erythema, no fluctuance or visible abscess. Neck: No stridor.  Hematological/Lymphatic/Immunilogical: No cervical  lymphadenopathy. Cardiovascular:   Normal rate, regular rhythm. Grossly normal heart sounds. Good peripheral circulation. Respiratory: Normal respiratory effort.  No retractions. Musculoskeletal: Steady gait. Neurologic:  Normal speech and language. No gait instability. Skin:  Skin is warm, dry and intact. No rash noted. Psychiatric: Mood and affect are normal. Speech and behavior are normal.  ____________________________________________   LABS (all labs ordered are listed, but only abnormal results are displayed)  Labs Reviewed - No data to display ____________________________________________   INITIAL IMPRESSION / ASSESSMENT AND PLAN / ED COURSE   Pertinent labs & imaging results that were available during my care of the patient were reviewed by me and considered in my medical decision making (see chart for details).   Well-appearing patient.  No acute distress.  Right lower dental infection with multiple caries and fractured teeth.  Will treat with Pen-Vee K, recommend Tylenol ibuprofen.  Strongly encourage dentist follow-up as soon as possible, local information given. Instructed to return to the Urgent Care or ER  As needed. ____________________________________________   FINAL CLINICAL IMPRESSION(S) / ED DIAGNOSES  Final diagnoses:  Pain, dental         Marylene Land, NP 02/11/19 1657

## 2019-02-21 ENCOUNTER — Encounter: Payer: Self-pay | Admitting: Physician Assistant

## 2019-02-21 ENCOUNTER — Other Ambulatory Visit: Payer: Self-pay

## 2019-02-21 ENCOUNTER — Emergency Department
Admission: EM | Admit: 2019-02-21 | Discharge: 2019-02-21 | Disposition: A | Discharge status: Home / Self Care | Payer: Self-pay | Attending: Emergency Medicine | Admitting: Emergency Medicine

## 2019-02-21 DIAGNOSIS — F1210 Cannabis abuse, uncomplicated: Secondary | ICD-10-CM | POA: Insufficient documentation

## 2019-02-21 DIAGNOSIS — Z79899 Other long term (current) drug therapy: Secondary | ICD-10-CM | POA: Insufficient documentation

## 2019-02-21 DIAGNOSIS — R509 Fever, unspecified: Secondary | ICD-10-CM

## 2019-02-21 DIAGNOSIS — Z87891 Personal history of nicotine dependence: Secondary | ICD-10-CM | POA: Insufficient documentation

## 2019-02-21 DIAGNOSIS — I10 Essential (primary) hypertension: Secondary | ICD-10-CM | POA: Insufficient documentation

## 2019-02-21 DIAGNOSIS — Z20828 Contact with and (suspected) exposure to other viral communicable diseases: Secondary | ICD-10-CM | POA: Insufficient documentation

## 2019-02-21 DIAGNOSIS — U071 COVID-19: Secondary | ICD-10-CM | POA: Insufficient documentation

## 2019-02-21 LAB — SARS CORONAVIRUS 2 (TAT 6-24 HRS): SARS Coronavirus 2: POSITIVE — AB

## 2019-02-21 LAB — INFLUENZA PANEL BY PCR (TYPE A & B)
Influenza A By PCR: NEGATIVE
Influenza B By PCR: NEGATIVE

## 2019-02-21 MED ORDER — ACETAMINOPHEN 325 MG PO TABS
650.0000 mg | ORAL_TABLET | Freq: Once | ORAL | Status: AC
  Administered 2019-02-21: 650 mg via ORAL
  Filled 2019-02-21: qty 2

## 2019-02-21 NOTE — ED Notes (Signed)
See triage note  States he developed subjective fever during the night  Took some IBU  And then woke up with am with same feeling  Low grade temp on arrival  State she "just doesn't feel well"

## 2019-02-21 NOTE — Discharge Instructions (Signed)
Your flu test was negative.  Advised self quarantine pending results of COVID-19.  You may monitor your results and your "my chart".  Advised Tylenol ibuprofen for pain/pain.

## 2019-02-21 NOTE — ED Provider Notes (Signed)
[facility] Emergency Department Provider Note   ____________________________________________   First MD Initiated Contact with Patient 02/21/19 1017     (approximate)  I have reviewed the triage vital signs and the nursing notes.   HISTORY  Chief Complaint Fever    HPI Michael Shaw is a 33 y.o. male patient state he woke up in the middle night with fever.  Patient states the ibuprofen and felt better until he waking this morning.  Patient  still believes he has a fever.  Patient states some mild discomfort secondary to his dental issues which is not being addressed today.  Patient states he works in a [employer] and switched the whole time through the layers are closed.  Patient denies recent travel or known exposure to COVID-19.  Patient denies nausea, vomiting, or other URI signs and symptoms.  Patient does have fatigue and body aches.          Past Medical History:  Diagnosis Date  . Hypertension   . Prediabetes     Patient Active Problem List   Diagnosis Date Noted  . Bradycardia 11/10/2013  . Elevated blood pressure 11/10/2013    History reviewed. No pertinent surgical history.  Prior to Admission medications   Medication Sig Start Date End Date Taking? Authorizing Provider  amLODipine (NORVASC) 5 MG tablet Take 5 mg by mouth daily.    [provider]  carisoprodol (SOMA) 350 MG tablet Take 1 tablet (350 mg total) by mouth 3 (three) times daily as needed. 07/10/17   Schaevitz, Myra Rude, MD  dicyclomine (BENTYL) 10 MG capsule Take 1 capsule (10 mg total) by mouth 3 (three) times daily as needed for up to 5 days (for cramps). 07/25/17 02/11/19  Dionne Bucy, MD  fluticasone (FLONASE) 50 MCG/ACT nasal spray Place 2 sprays into both nostrils daily. 03/28/18 02/11/19  Enid Derry, [name]    Allergies Patient has no known allergies.  Family History  Problem Relation Age of Onset  . Asthma Brother     Social  History Social History   Tobacco Use  . Smoking status: Former Smoker    Packs/day: 1.00    Years: 10.00    Pack years: 10.00    Types: Cigarettes  . Smokeless tobacco: Never Used  Substance Use Topics  . Alcohol use: Not Currently    Comment: rare   . Drug use: Yes    Types: Marijuana    Comment: last used last week    Review of Systems  Constitutional: No fever/chills Eyes: No visual changes. ENT: No sore throat.  Dental pain. Cardiovascular: Denies chest pain. Respiratory: Denies shortness of breath. Gastrointestinal: No abdominal pain.  No nausea, no vomiting.  No diarrhea.  No constipation. Genitourinary: Negative for dysuria. Musculoskeletal: Negative for back pain. Skin: Negative for rash. Neurological: Negative for headaches, focal weakness or numbness. Endocrine:  Hypertension prediabetic. ____________________________________________   PHYSICAL EXAM:  VITAL SIGNS: ED Triage Vitals  Enc Vitals Group     BP 02/21/19 1025 135/85     Pulse Rate 02/21/19 1025 93     Resp 02/21/19 1025 18     Temp 02/21/19 1025 100.2 F (37.9 C)     Temp Source 02/21/19 1025 Oral     SpO2 02/21/19 1025 98 %     Weight 02/21/19 1008 280 lb (127 kg)     Height 02/21/19 1008 6' (1.829 m)     Head Circumference --      Peak Flow --  Pain Score 02/21/19 1011 0     Pain Loc --      Pain Edu? --      Excl. in [place]? --    Constitutional: Alert and oriented. Well appearing and in no acute distress.  Febrile. Mouth/Throat: Mucous membranes are moist.  Oropharynx non-erythematous.  Multiple caries and devitalized teeth. Neck: No stridor.  Hematological/Lymphatic/Immunilogical: No cervical lymphadenopathy. Cardiovascular: Normal rate, regular rhythm. Grossly normal heart sounds.  Good peripheral circulation. Respiratory: Normal respiratory effort.  No retractions. Lungs CTAB. Gastrointestinal: Soft and nontender. No distention. No abdominal bruits. No CVA  tenderness. Musculoskeletal: No lower extremity tenderness nor edema.  No joint effusions. Neurologic:  Normal speech and language. No gross focal neurologic deficits are appreciated. No gait instability. Skin:  Skin is warm, dry and intact. No rash noted. Psychiatric: Mood and affect are normal. Speech and behavior are normal.  ____________________________________________   LABS (all labs ordered are listed, but only abnormal results are displayed)  Labs Reviewed  SARS CORONAVIRUS 2 (TAT 6-24 HRS)  INFLUENZA PANEL BY PCR (TYPE A & B)   ____________________________________________  EKG   ____________________________________________  RADIOLOGY  ED MD interpretation:    Official radiology report(s): No results found.  ____________________________________________   PROCEDURES  Procedure(s) performed (including Critical Care):  Procedures   ____________________________________________   INITIAL IMPRESSION / ASSESSMENT AND PLAN / ED COURSE  As part of my medical decision making, I reviewed the following data within the [place]     Patient presents with fever, fatigue, and body aches which started last night.  Discussed negative flu results with patient.  Patient advised self quarantine pending results of COVID-19.  Follow discharge care instructions.  Take Tylenol/ibuprofen if needed for pain or fever.    Michael Shaw was evaluated in Emergency Department on 02/21/2019 for the symptoms described in the history of present illness. He was evaluated in the context of the global COVID-19 pandemic, which necessitated consideration that the patient might be at risk for infection with the SARS-CoV-2 virus that causes COVID-19. Institutional protocols and algorithms that pertain to the evaluation of patients at risk for COVID-19 are in a state of rapid change based on information released by regulatory bodies including the CDC and federal and state  organizations. These policies and algorithms were followed during the patient's care in the ED.       ____________________________________________   FINAL CLINICAL IMPRESSION(S) / ED DIAGNOSES  Final diagnoses:  Febrile illness     ED Discharge Orders    None       Note:  This document was prepared using Dragon voice recognition software and may include unintentional dictation errors.    Sable Feil, [name] 02/21/19 1206    Arta Silence, MD 02/21/19 [phone]

## 2019-02-21 NOTE — ED Triage Notes (Signed)
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

## 2019-02-24 ENCOUNTER — Telehealth: Payer: Self-pay | Admitting: Emergency Medicine

## 2019-02-24 NOTE — Telephone Encounter (Signed)
Called patient to assure he is aware of covid positive.  He is aware.  Had questions and I answered. Explained isolationa nd quarantine guidelines.  He has already notified his employer.

## 2019-06-12 ENCOUNTER — Emergency Department
Admission: EM | Admit: 2019-06-12 | Discharge: 2019-06-12 | Disposition: A | Discharge status: Home / Self Care | Payer: Self-pay | Attending: Emergency Medicine | Admitting: Emergency Medicine

## 2019-06-12 ENCOUNTER — Encounter: Payer: Self-pay | Admitting: Emergency Medicine

## 2019-06-12 ENCOUNTER — Other Ambulatory Visit: Payer: Self-pay

## 2019-06-12 DIAGNOSIS — K047 Periapical abscess without sinus: Secondary | ICD-10-CM | POA: Insufficient documentation

## 2019-06-12 DIAGNOSIS — I10 Essential (primary) hypertension: Secondary | ICD-10-CM | POA: Insufficient documentation

## 2019-06-12 DIAGNOSIS — K029 Dental caries, unspecified: Secondary | ICD-10-CM | POA: Insufficient documentation

## 2019-06-12 DIAGNOSIS — Z87891 Personal history of nicotine dependence: Secondary | ICD-10-CM | POA: Insufficient documentation

## 2019-06-12 DIAGNOSIS — Z79899 Other long term (current) drug therapy: Secondary | ICD-10-CM | POA: Insufficient documentation

## 2019-06-12 MED ORDER — TRAMADOL HCL 50 MG PO TABS: 50.0000 mg | ORAL_TABLET | Freq: Four times a day (QID) | ORAL | 0 refills | Status: DC | PRN

## 2019-06-12 MED ORDER — OXYCODONE-ACETAMINOPHEN 5-325 MG PO TABS: 1.0000 | ORAL_TABLET | ORAL | 0 refills | Status: DC | PRN

## 2019-06-12 MED ORDER — CLINDAMYCIN HCL 300 MG PO CAPS: 300.0000 mg | ORAL_CAPSULE | Freq: Three times a day (TID) | ORAL | 0 refills | Status: AC

## 2019-06-12 MED ORDER — CLINDAMYCIN HCL 150 MG PO CAPS
300.0000 mg | ORAL_CAPSULE | Freq: Once | ORAL | Status: AC
  Administered 2019-06-12: 300 mg via ORAL
  Filled 2019-06-12: qty 2

## 2019-06-12 MED ORDER — LIDOCAINE VISCOUS HCL 2 % MT SOLN
15.0000 mL | Freq: Once | OROMUCOSAL | Status: AC
  Administered 2019-06-12: 15 mL via OROMUCOSAL
  Filled 2019-06-12: qty 15

## 2019-06-12 MED ORDER — CLINDAMYCIN HCL 300 MG PO CAPS: 300.0000 mg | ORAL_CAPSULE | Freq: Three times a day (TID) | ORAL | 0 refills | Status: DC

## 2019-06-12 MED ORDER — OXYCODONE-ACETAMINOPHEN 5-325 MG PO TABS
1.0000 | ORAL_TABLET | Freq: Once | ORAL | Status: AC
  Administered 2019-06-12: 1 via ORAL
  Filled 2019-06-12: qty 1

## 2019-06-12 NOTE — ED Triage Notes (Signed)
Patient ambulatory to triage with steady gait, without difficulty or distress noted, mask in place; pt reports left sided dental pain x 3 days; st taking ibuprofen without relief

## 2019-06-13 NOTE — ED Provider Notes (Signed)
[facility] Emergency Department Provider Note  ____________________________________________   First MD Initiated Contact with Patient 06/12/19 [phone]     (approximate)  I have reviewed the triage vital signs and the nursing notes.   HISTORY  Chief Complaint Dental Pain    HPI Michael Shaw is a 34 y.o. male with below list of previous medical conditions including multiple dental caries presents to the emergency department secondary to 10 out of 10 left maxillary dental pain x3 days.  Patient states that he has been taking ibuprofen without relief.  Patient states that he is been seen for dental caries in the past and is followed up both at [facility] and private dentist where he was informed that he would have to pay $5000 which he is unable to do.        Past Medical History:  Diagnosis Date  . Hypertension   . Prediabetes     Patient Active Problem List   Diagnosis Date Noted  . Bradycardia 11/10/2013  . Elevated blood pressure 11/10/2013    History reviewed. No pertinent surgical history.  Prior to Admission medications   Medication Sig Start Date End Date Taking? Authorizing Provider  amLODipine (NORVASC) 5 MG tablet Take 5 mg by mouth daily.    [provider]  carisoprodol (SOMA) 350 MG tablet Take 1 tablet (350 mg total) by mouth 3 (three) times daily as needed. 07/10/17   Schaevitz, Randall An, MD  clindamycin (CLEOCIN) 300 MG capsule Take 1 capsule (300 mg total) by mouth 3 (three) times daily for 10 days. 06/12/19 06/22/19  Gregor Hams, MD  oxyCODONE-acetaminophen (PERCOCET) 5-325 MG tablet Take 1 tablet by mouth every 4 (four) hours as needed. 06/12/19 06/11/20  Gregor Hams, MD  dicyclomine (BENTYL) 10 MG capsule Take 1 capsule (10 mg total) by mouth 3 (three) times daily as needed for up to 5 days (for cramps). 07/25/17 02/11/19  Arta Silence, MD  fluticasone (FLONASE) 50 MCG/ACT nasal spray Place 2  sprays into both nostrils daily. 03/28/18 02/11/19  Laban Emperor, [name]    Allergies Patient has no known allergies.  Family History  Problem Relation Age of Onset  . Asthma Brother     Social History Social History   Tobacco Use  . Smoking status: Former Smoker    Packs/day: 1.00    Years: 10.00    Pack years: 10.00    Types: Cigarettes  . Smokeless tobacco: Never Used  Substance Use Topics  . Alcohol use: Not Currently    Comment: rare   . Drug use: Yes    Types: Marijuana    Comment: last used last week    Review of Systems Constitutional: No fever/chills Eyes: No visual changes. ENT: No sore throat.  Positive for dental pain Cardiovascular: Denies chest pain. Respiratory: Denies shortness of breath. Gastrointestinal: No abdominal pain.  No nausea, no vomiting.  No diarrhea.  No constipation. Genitourinary: Negative for dysuria. Musculoskeletal: Negative for neck pain.  Negative for back pain. Integumentary: Negative for rash. Neurological: Negative for headaches, focal weakness or numbness.  ____________________________________________   PHYSICAL EXAM:  VITAL SIGNS: ED Triage Vitals  Enc Vitals Group     BP 06/12/19 0432 (!) 139/105     Pulse Rate 06/12/19 0432 80     Resp 06/12/19 0432 20     Temp 06/12/19 0432 99.1 F (37.3 C)     Temp Source 06/12/19 0432 Oral     SpO2 06/12/19  0432 98 %     Weight 06/12/19 0431 131.5 kg (290 lb)     Height 06/12/19 0431 1.93 m (6\' 4" )     Head Circumference --      Peak Flow --      Pain Score 06/12/19 0431 10     Pain Loc --      Pain Edu? --      Excl. in [place]? --     Constitutional: Alert and oriented.  Eyes: Conjunctivae are normal.  Mouth/Throat: Multiple dental caries both maxillary and mandibular, no signs of Ludwig's angina Neck: No stridor.  No meningeal signs.   Cardiovascular: Normal rate, regular rhythm. Good peripheral circulation. Grossly normal heart sounds. Respiratory: Normal respiratory  effort.  No retractions. Neurologic:  Normal speech and language. No gross focal neurologic deficits are appreciated.  Skin:  Skin is warm, dry and intact. Psychiatric: Mood and affect are normal. Speech and behavior are normal.    Procedures   ____________________________________________   INITIAL IMPRESSION / MDM / ASSESSMENT AND PLAN / ED COURSE  As part of my medical decision making, I reviewed the following data within the electronic MEDICAL RECORD NUMBER  34 year old male presenting with multiple dental caries and concern for possible dental abscess.  Patient given clindamycin in the emergency department will be prescribed the same for home patient also given a Percocet in the emergency department with referral to dentist. ____________________________________________  FINAL CLINICAL IMPRESSION(S) / ED DIAGNOSES  Final diagnoses:  Dental caries  Dental abscess     MEDICATIONS GIVEN DURING THIS VISIT:  Medications  oxyCODONE-acetaminophen (PERCOCET/ROXICET) 5-325 MG per tablet 1 tablet (1 tablet Oral Given 06/12/19 0523)  clindamycin (CLEOCIN) capsule 300 mg (300 mg Oral Given 06/12/19 0523)  lidocaine (XYLOCAINE) 2 % viscous mouth solution 15 mL (15 mLs Mouth/Throat Given 06/12/19 0524)     ED Discharge Orders         Ordered    clindamycin (CLEOCIN) 300 MG capsule  3 times daily,   Status:  Discontinued     06/12/19 0518    traMADol (ULTRAM) 50 MG tablet  Every 6 hours PRN,   Status:  Discontinued     06/12/19 0518    oxyCODONE-acetaminophen (PERCOCET) 5-325 MG tablet  Every 4 hours PRN     06/12/19 0519    clindamycin (CLEOCIN) 300 MG capsule  3 times daily     06/12/19 08/12/19          *Please note:  Michael Shaw was evaluated in Emergency Department on 06/13/2019 for the symptoms described in the history of present illness. He was evaluated in the context of the global COVID-19 pandemic, which necessitated consideration that the patient might be at risk for infection  with the SARS-CoV-2 virus that causes COVID-19. Institutional protocols and algorithms that pertain to the evaluation of patients at risk for COVID-19 are in a state of rapid change based on information released by regulatory bodies including the CDC and federal and state organizations. These policies and algorithms were followed during the patient's care in the ED.  Some ED evaluations and interventions may be delayed as a result of limited staffing during the pandemic.*  Note:  This document was prepared using Dragon voice recognition software and may include unintentional dictation errors.   08/13/2019, MD 06/13/19 [phone]

## 2019-07-24 ENCOUNTER — Ambulatory Visit
Admission: EM | Admit: 2019-07-24 | Discharge: 2019-07-24 | Disposition: A | Discharge status: Home / Self Care | Payer: Self-pay | Attending: Family Medicine | Admitting: Family Medicine

## 2019-07-24 ENCOUNTER — Other Ambulatory Visit: Payer: Self-pay

## 2019-07-24 DIAGNOSIS — K0889 Other specified disorders of teeth and supporting structures: Secondary | ICD-10-CM

## 2019-07-24 DIAGNOSIS — K029 Dental caries, unspecified: Secondary | ICD-10-CM

## 2019-07-24 MED ORDER — AMOXICILLIN-POT CLAVULANATE 875-125 MG PO TABS: 1.0000 | ORAL_TABLET | Freq: Two times a day (BID) | ORAL | 0 refills | Status: DC

## 2019-07-24 MED ORDER — OXYCODONE-ACETAMINOPHEN 5-325 MG PO TABS: 1.0000 | ORAL_TABLET | Freq: Three times a day (TID) | ORAL | 0 refills | Status: DC | PRN

## 2019-07-24 NOTE — ED Provider Notes (Signed)
[name]    CSN: [id number] Arrival date & time: 07/24/19  1107  History   Chief Complaint Chief Complaint  Patient presents with  . Dental Pain   HPI   34 year old male presents with dental pain.   Patient reports that he developed dental pain yesterday.  Right upper dentition.  He states that he took ibuprofen and this resolved his pain.  However, this morning he awoke due to dental pain.  He rates his pain as 10/10 in severity.  He states that he has seen a dentist recently for tooth extraction.  He states that he had a similar occurrence of a left upper tooth.  Pain currently severe and unrelenting.  He reports red/irritated gum.  No other associated symptoms.  No other complaints.  Past Medical History:  Diagnosis Date  . Hypertension   . Prediabetes    Patient Active Problem List   Diagnosis Date Noted  . Bradycardia 11/10/2013  . Elevated blood pressure 11/10/2013   Home Medications    Prior to Admission medications   Medication Sig Start Date End Date Taking? Authorizing Provider  amLODipine (NORVASC) 5 MG tablet Take 5 mg by mouth daily.    [provider]  amoxicillin-clavulanate (AUGMENTIN) 875-125 MG tablet Take 1 tablet by mouth every 12 (twelve) hours. 07/24/19   Tommie Sams, DO  oxyCODONE-acetaminophen (PERCOCET/ROXICET) 5-325 MG tablet Take 1-2 tablets by mouth every 8 (eight) hours as needed for severe pain. 07/24/19   Tommie Sams, DO  dicyclomine (BENTYL) 10 MG capsule Take 1 capsule (10 mg total) by mouth 3 (three) times daily as needed for up to 5 days (for cramps). 07/25/17 02/11/19  Dionne Bucy, MD  fluticasone (FLONASE) 50 MCG/ACT nasal spray Place 2 sprays into both nostrils daily. 03/28/18 02/11/19  Enid Derry, [name]    Family History Family History  Problem Relation Age of Onset  . Asthma Brother     Social History Social History   Tobacco Use  . Smoking status: Former Smoker    Packs/day: 1.00    Years:  10.00    Pack years: 10.00    Types: Cigarettes  . Smokeless tobacco: Never Used  Substance Use Topics  . Alcohol use: Yes    Comment: rare   . Drug use: Yes    Types: Marijuana    Comment: last used last week     Allergies   Patient has no known allergies.  Review of Systems Review of Systems  Constitutional: Negative for fever.  HENT: Positive for dental problem.    Physical Exam Triage Vital Signs ED Triage Vitals  Enc Vitals Group     BP 07/24/19 1117 (!) 136/96     Pulse Rate 07/24/19 1117 75     Resp 07/24/19 1117 16     Temp 07/24/19 1117 97.9 F (36.6 C)     Temp Source 07/24/19 1117 Oral     SpO2 07/24/19 1117 100 %     Weight 07/24/19 1115 (!) 310 lb (140.6 kg)     Height 07/24/19 1115 6\' 4"  (1.93 m)     Head Circumference --      Peak Flow --      Pain Score 07/24/19 1115 10     Pain Loc --      Pain Edu? --      Excl. in GC? --    Updated Vital Signs BP (!) 136/96 (BP Location: Right Arm)   Pulse 75  Temp 97.9 F (36.6 C) (Oral)   Resp 16   Ht 6\' 4"  (1.93 m)   Wt (!) 140.6 kg   SpO2 100%   BMI 37.73 kg/m   Visual Acuity Right Eye Distance:   Left Eye Distance:   Bilateral Distance:    Right Eye Near:   Left Eye Near:    Bilateral Near:     Physical Exam Vitals and nursing note reviewed.  Constitutional:      General: He is not in acute distress.    Appearance: Normal appearance. He is not ill-appearing.  HENT:     Head: Normocephalic and atraumatic.     Mouth/Throat:      Comments: Ppor dentition throughout; Multiple decaying teeth (down to gum).   Patient with tenderness at the labeled location.  Tooth is decayed down to the gumline.  Gum appears erythematous and edematous.  Eyes:     General:        Right eye: No discharge.        Left eye: No discharge.     Conjunctiva/sclera: Conjunctivae normal.  Cardiovascular:     Rate and Rhythm: Normal rate and regular rhythm.  Pulmonary:     Effort: Pulmonary effort is normal.      Breath sounds: Normal breath sounds. No wheezing, rhonchi or rales.  Neurological:     Mental Status: He is alert.  Psychiatric:        Mood and Affect: Mood normal.        Behavior: Behavior normal.    UC Treatments / Results  Labs (all labs ordered are listed, but only abnormal results are displayed) Labs Reviewed - No data to display  EKG   Radiology No results found.  Procedures Procedures (including critical care time)  Medications Ordered in UC Medications - No data to display  Initial Impression / Assessment and Plan / UC Course  I have reviewed the triage vital signs and the nursing notes.  Pertinent labs & imaging results that were available during my care of the patient were reviewed by me and considered in my medical decision making (see chart for details).    34 year old male presents with dental pain.   Placing on Augmentin to cover for developing abscess.  Oxycodone as needed for pain.  [place] controlled substance database reviewed. Advised f/u with dentist.  Final Clinical Impressions(s) / UC Diagnoses   Final diagnoses:  Pain, dental  Dental caries     Discharge Instructions     Medication as prescribed.  See Dentist.  Take care   Dr Lacinda Axon    ED Prescriptions    Medication Sig Osborn. Provider   amoxicillin-clavulanate (AUGMENTIN) 875-125 MG tablet Take 1 tablet by mouth every 12 (twelve) hours. 20 tablet Zylan Almquist G, DO   oxyCODONE-acetaminophen (PERCOCET/ROXICET) 5-325 MG tablet Take 1-2 tablets by mouth every 8 (eight) hours as needed for severe pain. 10 tablet Thersa Salt G, DO     I have reviewed the PDMP during this encounter.   Coral Spikes, DO 07/24/19 1213

## 2019-07-24 NOTE — Discharge Instructions (Signed)
Medication as prescribed.  See Dentist.  Take care  Dr. Elwood Bazinet 

## 2019-07-24 NOTE — ED Triage Notes (Signed)
Pt with right upper jaw dental pain. Started yesterday and was managed with Ibuprofen. This a.m. it woke him from sleep

## 2019-12-11 ENCOUNTER — Ambulatory Visit
Admission: EM | Admit: 2019-12-11 | Discharge: 2019-12-11 | Disposition: A | Discharge status: Home / Self Care | Payer: Self-pay | Attending: Internal Medicine | Admitting: Internal Medicine

## 2019-12-11 ENCOUNTER — Other Ambulatory Visit: Payer: Self-pay

## 2019-12-11 DIAGNOSIS — K047 Periapical abscess without sinus: Secondary | ICD-10-CM

## 2019-12-11 MED ORDER — CLINDAMYCIN HCL 300 MG PO CAPS
300.0000 mg | ORAL_CAPSULE | Freq: Three times a day (TID) | ORAL | 0 refills | Status: DC
Start: 2019-12-11 — End: 2020-05-25

## 2019-12-11 NOTE — ED Provider Notes (Signed)
[name]    CSN: [id number] Arrival date & time: 12/11/19  1055      History   Chief Complaint Chief Complaint  Patient presents with   Abscess    HPI Michael Shaw is a 34 y.o. male who presents with a swelling of   his L side face while sitting watching TV. He could not open his mouth yesterday due to the swelling and pain. He has 2 carious molars on the upper gum area. States he was not eating anything at the time of pain. He woke up this am and the pain is worse.     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 No pertinent surgical history.     Home Medications    Prior to Admission medications   Medication Sig Start Date End Date Taking? Authorizing Provider  amLODipine (NORVASC) 5 MG tablet Take 5 mg by mouth daily.    [provider]  amoxicillin-clavulanate (AUGMENTIN) 875-125 MG tablet Take 1 tablet by mouth every 12 (twelve) hours. 07/24/19   Tommie Sams, DO  oxyCODONE-acetaminophen (PERCOCET/ROXICET) 5-325 MG tablet Take 1-2 tablets by mouth every 8 (eight) hours as needed for severe pain. 07/24/19   Tommie Sams, DO  dicyclomine (BENTYL) 10 MG capsule Take 1 capsule (10 mg total) by mouth 3 (three) times daily as needed for up to 5 days (for cramps). 07/25/17 02/11/19  Dionne Bucy, MD  fluticasone (FLONASE) 50 MCG/ACT nasal spray Place 2 sprays into both nostrils daily. 03/28/18 02/11/19  Enid Derry, [name]    Family History Family History  Problem Relation Age of Onset   Asthma Brother     Social History Social History   Tobacco Use   Smoking status: Former Smoker    Packs/day: 1.00    Years: 10.00    Pack years: 10.00    Types: Cigarettes   Smokeless tobacco: Never Used  Building services engineer Use: Never used  Substance Use Topics    Alcohol use: Yes    Comment: rare    Drug use: Yes    Types: Marijuana    Comment: last used last week     Allergies   Patient has no known allergies.   Review of Systems Review of Systems  Constitutional: Negative for appetite change, chills, diaphoresis, fatigue and fever.  HENT: Positive for dental problem and facial swelling. Negative for congestion, mouth sores, sinus pressure and sinus pain.   Respiratory: Negative for cough.   Musculoskeletal: Negative for gait problem and myalgias.  Neurological: Negative for headaches.  Hematological: Negative for adenopathy.     Physical Exam Triage Vital Signs ED Triage Vitals  Enc Vitals Group     BP 12/11/19 1136 (!) 160/105     Pulse Rate 12/11/19 1136 82     Resp 12/11/19 1136 18     Temp 12/11/19 1136 98.4 F (36.9 C)     Temp Source 12/11/19 1136 Oral     SpO2 12/11/19 1136 97 %     Weight 12/11/19 1137 (!) 310 lb (140.6 kg)     Height 12/11/19 1137 6\' 4"  (1.93 m)     Head Circumference --      Peak Flow --      Pain Score 12/11/19 1137 10  Pain Loc --      Pain Edu? --      Excl. in GC? --    No data found.  Updated Vital Signs BP (!) 160/105 Comment: have not taken BP meds today, usually take in the afternoon   Pulse 82    Temp 98.4 F (36.9 C) (Oral)    Resp 18    Ht 6\' 4"  (1.93 m)    Wt (!) 310 lb (140.6 kg)    SpO2 97%    BMI 37.73 kg/m   Visual Acuity Right Eye Distance:   Left Eye Distance:   Bilateral Distance:    Right Eye Near:   Left Eye Near:    Bilateral Near:     Physical Exam Vitals and nursing note reviewed.  Constitutional:      General: He is not in acute distress.    Appearance: He is obese. He is not toxic-appearing.  HENT:     Head: Normocephalic.     Nose: Nose normal.     Mouth/Throat:     Mouth: Mucous membranes are moist.     Pharynx: Oropharynx is clear.     Comments: L buccal mucosa is mildly swollen and is tender on the mid area. Has 2 missing molars on upper  region, mid way and the last one. Looks like the roots are still there. Gums on the areas where the molars are missing are erythematous Eyes:     General: No scleral icterus.    Conjunctiva/sclera: Conjunctivae normal.  Pulmonary:     Effort: Pulmonary effort is normal.  Musculoskeletal:        General: Normal range of motion.     Cervical back: Neck supple.  Lymphadenopathy:     Cervical: No cervical adenopathy.  Skin:    General: Skin is warm and dry.  Neurological:     Mental Status: He is alert and oriented to person, place, and time.     Gait: Gait normal.  Psychiatric:        Mood and Affect: Mood normal.        Behavior: Behavior normal.        Thought Content: Thought content normal.        Judgment: Judgment normal.      UC Treatments / Results  Labs (all labs ordered are listed, but only abnormal results are displayed) Labs Reviewed - No data to display  EKG   Radiology No results found.  Procedures Procedures (including critical care time)  Medications Ordered in UC Medications - No data to display  Initial Impression / Assessment and Plan / UC Course  I have reviewed the triage vital signs and the nursing notes. Dental abscess. I placed him on Clindamycin. Advised to see dentist to extract the rest of those molars.  Final Clinical Impressions(s) / UC Diagnoses   Final diagnoses:  None   Discharge Instructions   None    ED Prescriptions    None     PDMP not reviewed this encounter.   , [name] 12/11/19 1200

## 2019-12-11 NOTE — Discharge Instructions (Signed)
Take Tylenol up to 1000 mg every 6 hours for pain  

## 2019-12-11 NOTE — ED Triage Notes (Signed)
Pt reports having abscess on L, upper side of mouth. sts there is pain and swelling to the area. Pt reports unable to open mouth yesterday.  No other concerns at this time.

## 2020-05-13 IMAGING — CR DG CHEST 2V
2 series · 2 of 2 positions shown · non-contrast
Comparison: 10/20/2016 and earlier.

CLINICAL DATA: Three-day history of fever, chills, diaphoresis,
generalized weakness, sore throat and myalgias. Daughter recently
diagnosed with influenza.

EXAM:
CHEST - 2 VIEW

[chest pa]
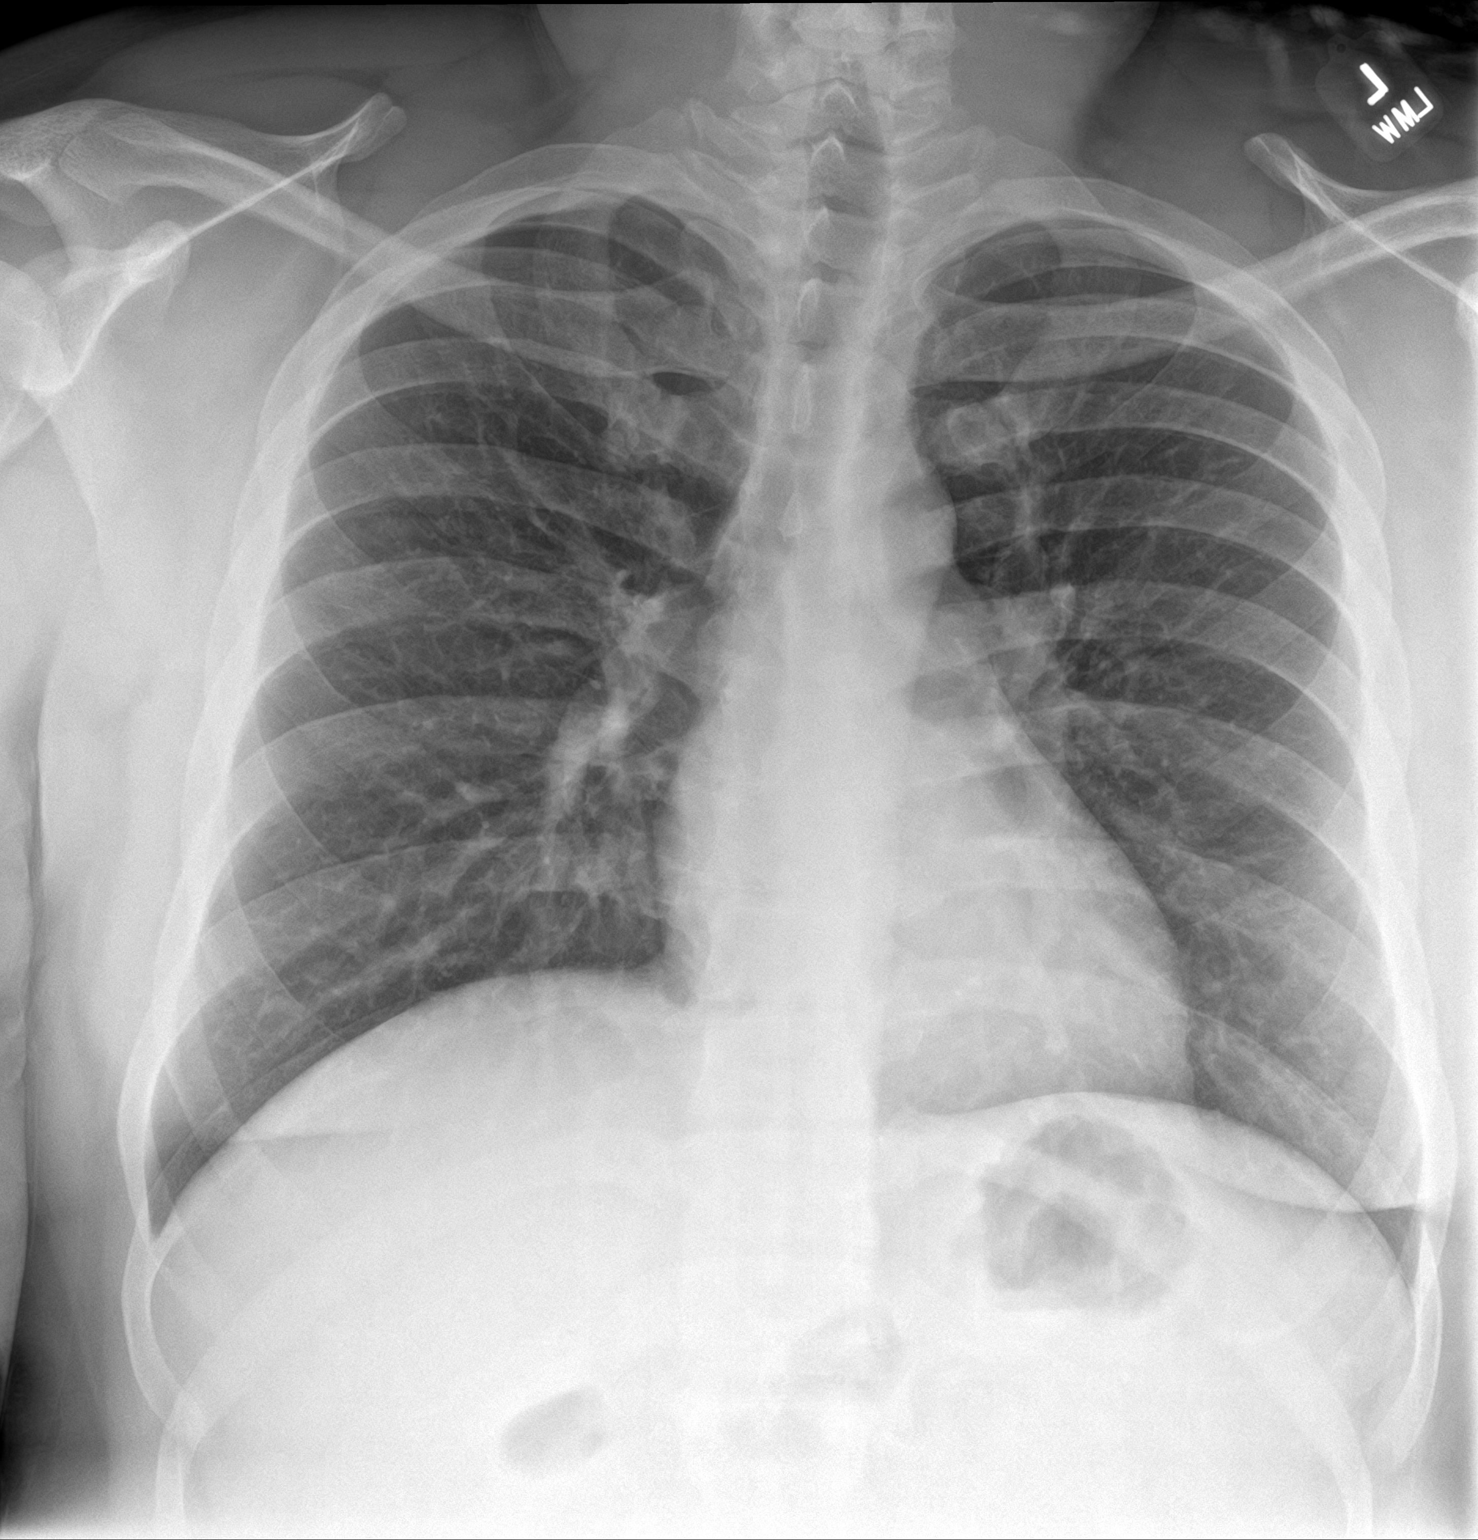

[chest lat]
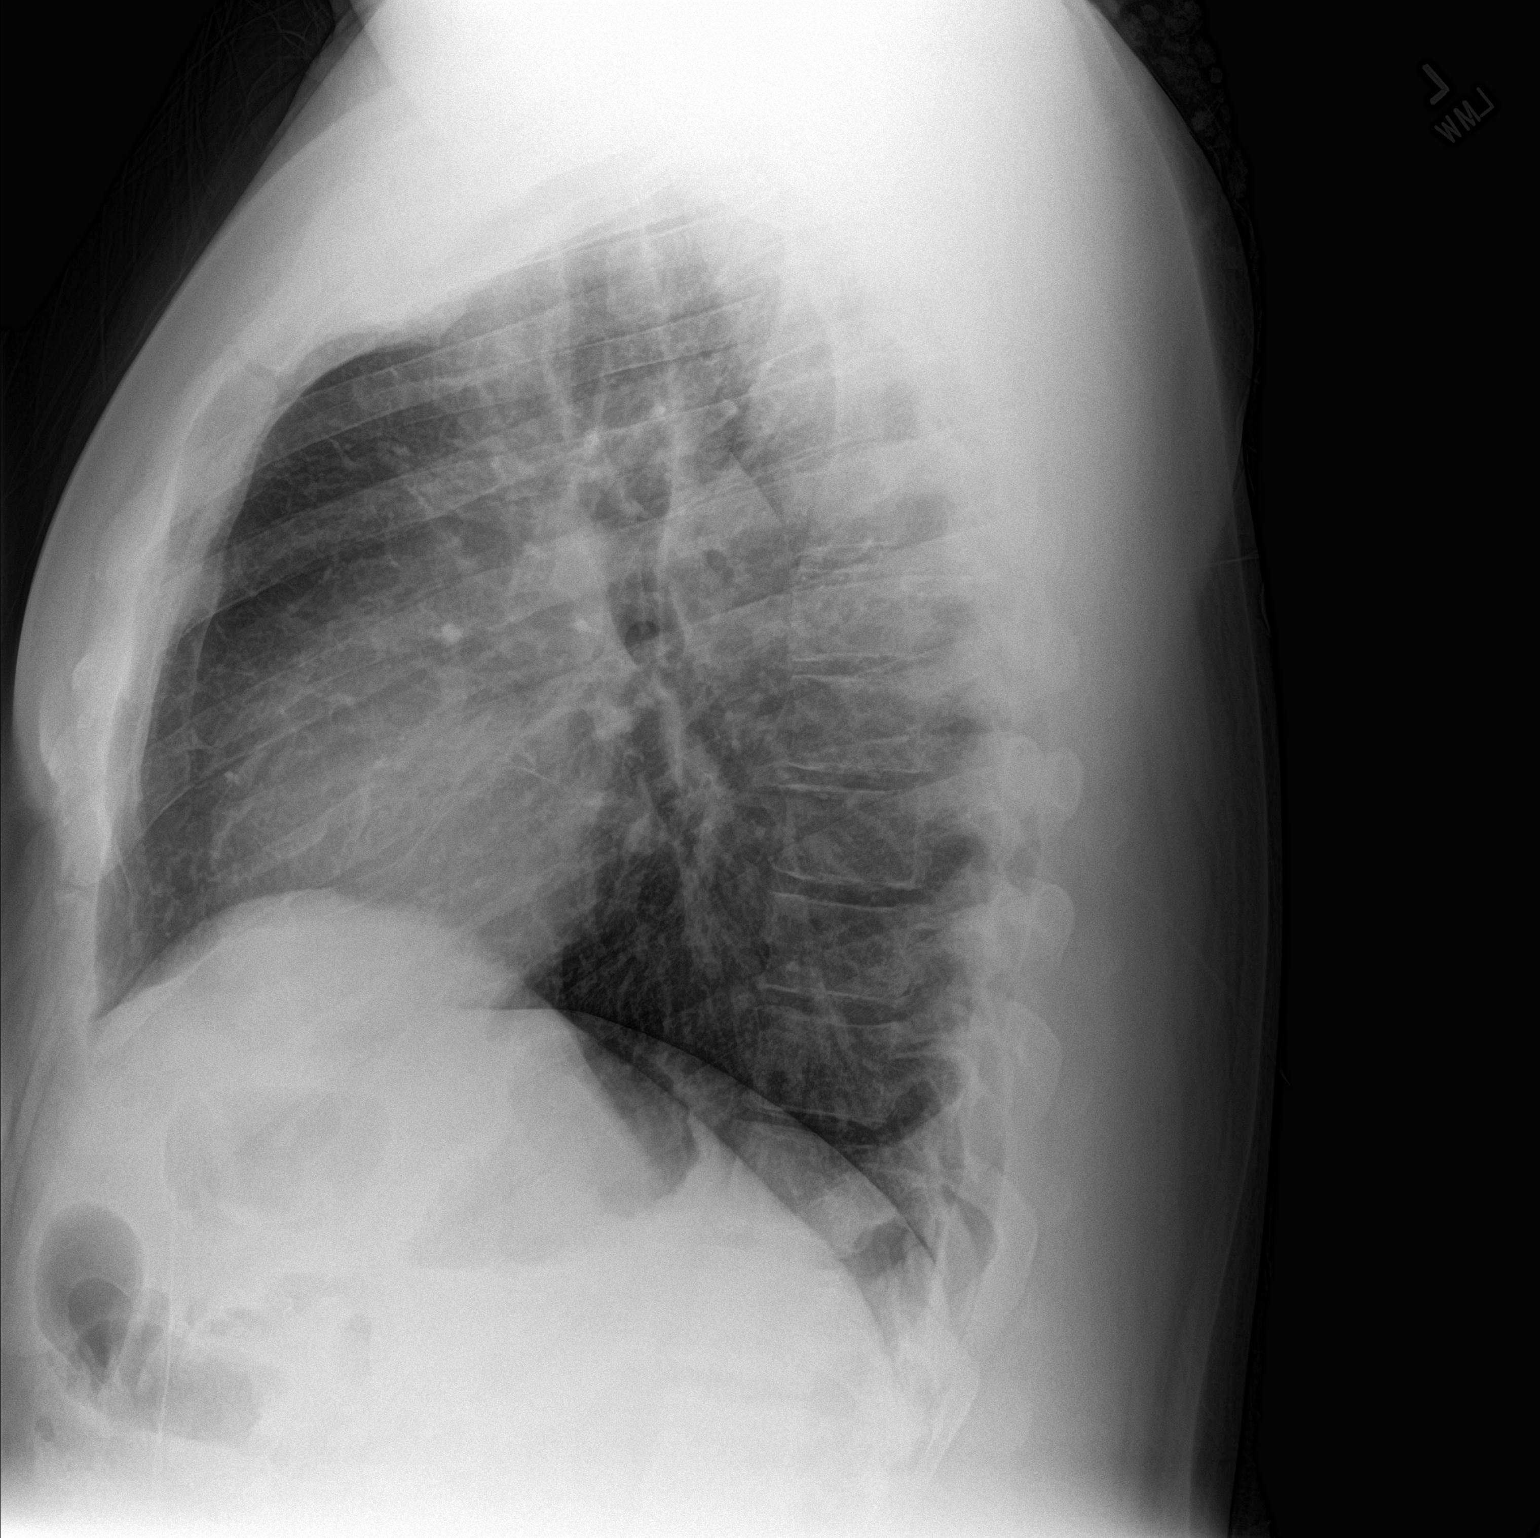

[2 of 2 positions shown; findings below may reference images not displayed]

FINDINGS: Cardiomediastinal silhouette unremarkable and unchanged. Lungs
clear. Bronchovascular markings normal. Pulmonary vascularity
normal. No visible pleural effusions. No pneumothorax. Visualized
bony thorax intact. No interval change.
IMPRESSION: No acute cardiopulmonary disease. Stable examination.

## 2020-05-25 ENCOUNTER — Ambulatory Visit
Admission: EM | Admit: 2020-05-25 | Discharge: 2020-05-25 | Disposition: A | Discharge status: Home / Self Care | Payer: Self-pay | Attending: Family Medicine | Admitting: Family Medicine

## 2020-05-25 ENCOUNTER — Other Ambulatory Visit: Payer: Self-pay

## 2020-05-25 ENCOUNTER — Encounter: Payer: Self-pay | Admitting: Emergency Medicine

## 2020-05-25 DIAGNOSIS — M25562 Pain in left knee: Secondary | ICD-10-CM

## 2020-05-25 MED ORDER — PREDNISONE 10 MG PO TABS: ORAL_TABLET | ORAL | 0 refills | Status: DC

## 2020-05-25 NOTE — ED Triage Notes (Signed)
Pt c/o left knee pain and swelling. Started about 2 days ago. Denies injury however he says he did help his sister move and a lot of stairs to climb.

## 2020-05-25 NOTE — ED Provider Notes (Signed)
[name]    CSN: [id number] Arrival date & time: 05/25/20  1154  History   Chief Complaint Chief Complaint  Patient presents with  . Knee Pain    left   HPI  35 year old male presents with left knee pain.  Started approximate 2 days ago.  Occurred after he was doing a lot of lifting and going up stairs as he was helping his sister move.  He reports anterior pain and associated swelling.  Decreased range of motion.  Pain is 10/10 in severity.  He has taken ibuprofen with some improvement but not resolution.  No other medication interventions tried.  No other complaints.  Past Medical History:  Diagnosis Date  . Hypertension   . Prediabetes    Patient Active Problem List   Diagnosis Date Noted  . Bradycardia 11/10/2013  . Elevated blood pressure 11/10/2013   Home Medications    Prior to Admission medications   Medication Sig Start Date End Date Taking? Authorizing Provider  amLODipine (NORVASC) 5 MG tablet Take 5 mg by mouth daily.   Yes [provider]  predniSONE (DELTASONE) 10 MG tablet 50 mg daily x 2 days, then 40 mg daily x 2 days, then 30 mg daily x 2 days, then 20 mg daily x 2 days, then 10 mg daily x 2 days. 05/25/20  Yes Jamariya Davidoff G, DO  dicyclomine (BENTYL) 10 MG capsule Take 1 capsule (10 mg total) by mouth 3 (three) times daily as needed for up to 5 days (for cramps). 07/25/17 02/11/19  Dionne Bucy, MD  fluticasone (FLONASE) 50 MCG/ACT nasal spray Place 2 sprays into both nostrils daily. 03/28/18 02/11/19  Enid Derry, [name]    Family History Family History  Problem Relation Age of Onset  . Asthma Brother     Social History Social History   Tobacco Use  . Smoking status: Former Smoker    Packs/day: 1.00    Years: 10.00    Pack years: 10.00    Types: Cigarettes  . Smokeless tobacco: Never Used  Vaping Use  . Vaping Use: Never used  Substance Use Topics  . Alcohol use: Yes    Comment: rare   . Drug use: Yes    Types:  Marijuana    Comment: last used last week     Allergies   Patient has no known allergies.   Review of Systems Review of Systems  Constitutional: Negative.   Musculoskeletal:       Left knee pain, swelling.   Physical Exam Triage Vital Signs ED Triage Vitals  Enc Vitals Group     BP 05/25/20 1216 (!) 136/91     Pulse Rate 05/25/20 1216 84     Resp 05/25/20 1216 18     Temp 05/25/20 1216 98.6 F (37 C)     Temp Source 05/25/20 1216 Oral     SpO2 05/25/20 1216 100 %     Weight 05/25/20 1215 (!) 309 lb 15.5 oz (140.6 kg)     Height 05/25/20 1215 6\' 4"  (1.93 m)     Head Circumference --      Peak Flow --      Pain Score 05/25/20 1214 10     Pain Loc --      Pain Edu? --      Excl. in GC? --    No data found.  Updated Vital Signs BP (!) 136/91 (BP Location: Right Arm)   Pulse 84   Temp 98.6 F (37  C) (Oral)   Resp 18   Ht 6\' 4"  (1.93 m)   Wt (!) 140.6 kg   SpO2 100%   BMI 37.73 kg/m   Visual Acuity Right Eye Distance:   Left Eye Distance:   Bilateral Distance:    Right Eye Near:   Left Eye Near:    Bilateral Near:     Physical Exam Vitals and nursing note reviewed.  Constitutional:      General: He is not in acute distress.    Appearance: Normal appearance. He is not ill-appearing.  HENT:     Head: Normocephalic and atraumatic.  Eyes:     General:        Right eye: No discharge.        Left eye: No discharge.     Conjunctiva/sclera: Conjunctivae normal.  Pulmonary:     Effort: Pulmonary effort is normal. No respiratory distress.  Musculoskeletal:     Comments: Left knee -no effusion noted.  No anterior joint line tenderness.  Neurological:     Mental Status: He is alert.  Psychiatric:        Mood and Affect: Mood normal.        Behavior: Behavior normal.    UC Treatments / Results  Labs (all labs ordered are listed, but only abnormal results are displayed) Labs Reviewed - No data to display  EKG   Radiology No results  found.  Procedures Procedures (including critical care time)  Medications Ordered in UC Medications - No data to display  Initial Impression / Assessment and Plan / UC Course  I have reviewed the triage vital signs and the nursing notes.  Pertinent labs & imaging results that were available during my care of the patient were reviewed by me and considered in my medical decision making (see chart for details).    35 year old male presents with acute knee pain.  Secondary to overuse.  Likely component of bursitis.  Treating with prednisone.  Final Clinical Impressions(s) / UC Diagnoses   Final diagnoses:  Acute pain of left knee     Discharge Instructions     Rest, ice, elevation.  Medication as prescribed.  Take care  Dr. 20    ED Prescriptions    Medication Sig Dispense Auth. Provider   predniSONE (DELTASONE) 10 MG tablet 50 mg daily x 2 days, then 40 mg daily x 2 days, then 30 mg daily x 2 days, then 20 mg daily x 2 days, then 10 mg daily x 2 days. 30 tablet Adriana Simas G, DO     I have reviewed the PDMP during this encounter.   Everlene Other, Tommie Sams 05/25/20 1349

## 2020-05-25 NOTE — Discharge Instructions (Addendum)
Rest, ice, elevation.  Medication as prescribed.  Take care  Dr. Andra Heslin  

## 2021-01-30 ENCOUNTER — Other Ambulatory Visit: Payer: Self-pay

## 2021-01-30 ENCOUNTER — Emergency Department
Admission: EM | Admit: 2021-01-30 | Discharge: 2021-01-30 | Disposition: A | Discharge status: Home / Self Care | Payer: Self-pay | Attending: Emergency Medicine | Admitting: Emergency Medicine

## 2021-01-30 ENCOUNTER — Encounter: Payer: Self-pay | Admitting: Emergency Medicine

## 2021-01-30 DIAGNOSIS — Y93C9 Activity, other involving computer technology and electronic devices: Secondary | ICD-10-CM | POA: Insufficient documentation

## 2021-01-30 DIAGNOSIS — S60921A Unspecified superficial injury of right hand, initial encounter: Secondary | ICD-10-CM | POA: Insufficient documentation

## 2021-01-30 DIAGNOSIS — Z5321 Procedure and treatment not carried out due to patient leaving prior to being seen by health care provider: Secondary | ICD-10-CM | POA: Insufficient documentation

## 2021-01-30 DIAGNOSIS — W228XXA Striking against or struck by other objects, initial encounter: Secondary | ICD-10-CM | POA: Insufficient documentation

## 2021-01-30 NOTE — ED Notes (Signed)
Pt reported that he was going to [employer]

## 2021-01-30 NOTE — ED Triage Notes (Signed)
Pt reports got upset while watching a football game and punched the TV. Pt with multiple small wounds to right hand, no bleeding at this time. Pt able to move hand but reports it hurts.

## 2021-05-12 ENCOUNTER — Other Ambulatory Visit: Payer: Self-pay

## 2021-05-12 ENCOUNTER — Ambulatory Visit
Admission: EM | Admit: 2021-05-12 | Discharge: 2021-05-12 | Disposition: A | Discharge status: Home / Self Care | Payer: Self-pay | Attending: Emergency Medicine | Admitting: Emergency Medicine

## 2021-05-12 DIAGNOSIS — K047 Periapical abscess without sinus: Secondary | ICD-10-CM

## 2021-05-12 MED ORDER — AMOXICILLIN-POT CLAVULANATE 875-125 MG PO TABS: 1.0000 | ORAL_TABLET | Freq: Two times a day (BID) | ORAL | 0 refills | Status: AC

## 2021-05-12 MED ORDER — CHLORHEXIDINE GLUCONATE 0.12 % MT SOLN: OROMUCOSAL | 0 refills | Status: DC

## 2021-05-12 MED ORDER — IBUPROFEN 600 MG PO TABS: 600.0000 mg | ORAL_TABLET | Freq: Four times a day (QID) | ORAL | 0 refills | Status: DC | PRN

## 2021-05-12 NOTE — ED Triage Notes (Signed)
Patient is here for "dental pain, upper left side of mouth, teeth'. Started this am. No fever. No injury.  ?

## 2021-05-12 NOTE — ED Provider Notes (Signed)
HPI ? ?SUBJECTIVE: ? ?Michael Shaw is a 36 y.o. male who presents with constant, dull, achy, throbbing left upper dental pain and gingival swelling starting today.  States that he had pain with biting down last night.  No drainage from the area, no known trauma to the tooth.  He does have a bad tooth in this area that he has recurrent issues with.  No fevers, facial swelling, trismus.  He tried 800 mg of ibuprofen with improvement in his symptoms, and took a leftover tab of amoxicillin unknown dosage x1.  No aggravating factors.  He took ibuprofen within 6 hours of evaluation.  He states that this is an ongoing, longstanding issue, and that this happens at least twice a year.  He has a past medical history of hypertension.  No history of immunocompromise, diabetes, HIV.  Dentist: None ? ? ? ?Past Medical History:  ?Diagnosis Date  ? Hypertension   ? Prediabetes   ? ? ?History reviewed. No pertinent surgical history. ? ?Family History  ?Problem Relation Age of Onset  ? Asthma Brother   ? ? ?Social History  ? ?Tobacco Use  ? Smoking status: Former  ?  Packs/day: 1.00  ?  Years: 10.00  ?  Pack years: 10.00  ?  Types: Cigarettes  ? Smokeless tobacco: Never  ?Vaping Use  ? Vaping Use: Every day  ? Devices: Non Nicotine.  ?Substance Use Topics  ? Alcohol use: Yes  ?  Comment: rare   ? Drug use: Yes  ?  Types: Marijuana  ?  Comment: last used last week  ? ? ?No current facility-administered medications for this encounter. ? ?Current Outpatient Medications:  ?  amLODipine (NORVASC) 5 MG tablet, Take 5 mg by mouth daily., Disp: , Rfl:  ?  amoxicillin-clavulanate (AUGMENTIN) 875-125 MG tablet, Take 1 tablet by mouth 2 (two) times daily for 7 days., Disp: 14 tablet, Rfl: 0 ?  chlorhexidine (PERIDEX) 0.12 % solution, 15 mL swish and spit bid, Disp: 480 mL, Rfl: 0 ?  ibuprofen (ADVIL) 600 MG tablet, Take 1 tablet (600 mg total) by mouth every 6 (six) hours as needed., Disp: 30 tablet, Rfl: 0 ? ?No Known  Allergies ? ? ?ROS ? ?As noted in HPI.  ? ?Physical Exam ? ?BP (!) 129/91 (BP Location: Left Arm)   Pulse 63   Temp 98.6 ?F (37 ?C) (Oral)   Resp 18   Ht 6\' 4"  (1.93 m)   Wt (!) 140.6 kg   SpO2 100%   BMI 37.73 kg/m?  ? ?Constitutional: Well developed, well nourished, no acute distress ?Eyes:  EOMI, conjunctiva normal bilaterally ?HENT: Normocephalic, atraumatic,mucus membranes moist.  Extensively poor dentition.  Tooth #16, third left upper molar, extensively decayed, tender to palpation.  Positive surrounding gingival swelling, tenderness.  No expressible purulent drainage.  No facial swelling, trismus, buccal swelling. ?Neck: No cervical lymphadenopathy. ?Respiratory: Normal inspiratory effort ?Cardiovascular: Normal rate ?GI: nondistended ?skin: No rash, skin intact ?Musculoskeletal: no deformities ?Neurologic: Alert & oriented x 3, no focal neuro deficits ?Psychiatric: Speech and behavior appropriate ? ? ?ED Course ? ? ?Medications - No data to display ? ?No orders of the defined types were placed in this encounter. ? ? ?No results found for this or any previous visit (from the past 24 hour(s)). ?No results found. ? ?ED Clinical Impression ? ?1. Dental abscess   ?  ? ?ED Assessment/Plan ? ?Patient with a dental infection/gingival abscess.  There is no evidence of a buccal  abscess at this time.  Home with Augmentin, Tylenol/ibuprofen, Peridex.  Or he may use Listerine.  Salt water rinses.   will provide a dental list, also suggested following up with the [facility]. ? ?Discussed MDM, treatment plan, and plan for follow-up with patient. Discussed sn/sx that should prompt return to the ED. patient agrees with plan.  ? ?Meds ordered this encounter  ?Medications  ? chlorhexidine (PERIDEX) 0.12 % solution  ?  Sig: 15 mL swish and spit bid  ?  Dispense:  480 mL  ?  Refill:  0  ? ibuprofen (ADVIL) 600 MG tablet  ?  Sig: Take 1 tablet (600 mg total) by mouth every 6 (six) hours as needed.  ?  Dispense:  30  tablet  ?  Refill:  0  ? amoxicillin-clavulanate (AUGMENTIN) 875-125 MG tablet  ?  Sig: Take 1 tablet by mouth 2 (two) times daily for 7 days.  ?  Dispense:  14 tablet  ?  Refill:  0  ? ? ? ? ?*This clinic note was created using Scientist, clinical (histocompatibility and immunogenetics). Therefore, there may be occasional mistakes despite careful proofreading. ? ?? ? ?  ?Domenick Gong, MD ?05/12/21 1635 ? ?

## 2021-05-12 NOTE — Discharge Instructions (Addendum)
Take 600 mg of ibuprofen combined with 1000 mg of Tylenol 3 or 4 times a day as needed for pain.  Finish the Augmentin, even if you feel better.  You can use Listerine or chlorhexidine mouthwash as an antibacterial rinse.  Salt water rinses can also be very helpful. ? ?OPTIONS FOR DENTAL FOLLOW UP CARE ? ?[place] [facility] and [facility] Services - Local Safety Net Dental Clinics ?TripDoors.com.htm ?  ?[facility] [phone]) ? ?[employer] [phone]) ? ?[facility] [phone]) ? ?[facility] [phone]) ? ?[facility] [phone]) ?This clinic caters to the indigent population and is on a lottery system. ?Location: ?[employer] [facility], [employer], [address], [place] ?Clinic Hours: ?Wednesdays from 6pm - 9pm, patients seen by a lottery system. ?For dates, call or go to [email or web address] ?Services: ?Cleanings, fillings and simple extractions. ?Payment Options: ?DENTAL WORK IS FREE OF CHARGE. Bring proof of income or support. ?Best way to get seen: ?Arrive at 5:15 pm - this is a lottery, NOT first come/first serve, so arriving earlier will not increase your chances of being seen. ?  ?  ?[facility] ?[phone] ?Select option 1 for emergencies ?  ?Location: ?[employer] [facility], [employer], [address] [place] ?Clinic Hours: ?No walk-ins accepted - call the day before to schedule an appointment. ?Check in times are 9:30 am and 1:30 pm. ?Services: ?Simple extractions, temporary fillings, pulpectomy/pulp debridement, uncomplicated abscess drainage. ?Payment Options: ?PAYMENT IS DUE AT THE TIME OF SERVICE.  Fee is usually $100-200, additional surgical procedures (e.g. abscess drainage) may be extra. ?Cash, checks, Visa/MasterCard accepted.  Can file Medicaid if patient is covered for dental - patient should  call case worker to check. ?No discount for [facility] patients. ?Best way to get seen: ?MUST call the day before and get onto the schedule. Can usually be seen the next 1-2 days. No walk-ins accepted. ?  ?  ?[facility] ?[phone] ?  ?Location: ?[facility], [address], [place] ?Clinic Hours: ?M, W, Th, F 8am or 1:30pm, Tues 9a or 1:30 - first come/first served. ?Services: ?Simple extractions, temporary fillings, uncomplicated abscess drainage.  You do not need to be an [facility] resident. ?Payment Options: ?PAYMENT IS DUE AT THE TIME OF SERVICE. ?Dental insurance, otherwise sliding scale - bring proof of income or support. ?Depending on income and treatment needed, cost is usually $50-200. ?Best way to get seen: ?Arrive early as it is first come/first served. ?  ?  ?[facility] ?[phone] ?  ?Location: ?[address] ?Clinic Hours: ?Mon-Thu 8a-5p ?Services: ?Most basic dental services including extractions and fillings. ?Payment Options: ?PAYMENT IS DUE AT THE TIME OF SERVICE. ?Sliding scale, up to 50% off - bring proof if income or support. ?Medicaid with dental option accepted. ?Best way to get seen: ?Call to schedule an appointment, can usually be seen within 2 weeks OR they will try to see walk-ins - show up at 8a or 2p (you may have to wait). ?  ?  ?[facility] ?[phone] ?[place] RESIDENTS ONLY ?  ?Location: ?[facility] [employer], [address], [place], [place] [address] ?Clinic Hours: By appointment only. ?Monday - Thursday 8am-5pm, Friday 8am-12pm ?Services: Cleanings, fillings, extractions. ?Payment Options: ?PAYMENT IS DUE AT THE TIME OF SERVICE. ?Cash, Visa or MasterCard. Sliding scale - $30 minimum per service. ?Best way to get seen: ?  Come in to office, complete packet and make an appointment - need proof of income ?or support monies for each household member and  proof of [facility] residence. ?Usually takes about a month to get in. ?  ?  ?[facility] ?[phone] ?  ?Location: ?[address] [facility] ?Clinic Hours: Walk-in Urgent Care Dental Services are offered Monday-Friday mornings only. ?The numbers of emergencies accepted daily is limited to the number of ?providers available. ?Maximum 15 - Mondays, Wednesdays & Thursdays ?Maximum 10 - Tuesdays & Fridays ?Services: ?You do not need to be a [facility] resident to be seen for a dental emergency. ?Emergencies are defined as pain, swelling, abnormal bleeding, or dental trauma. Walkins will receive x-rays if needed. ?NOTE: Dental cleaning is not an emergency. ?Payment Options: ?PAYMENT IS DUE AT THE TIME OF SERVICE. ?Minimum co-pay is $40.00 for uninsured patients. ?Minimum co-pay is $3.00 for Medicaid with dental coverage. ?Dental Insurance is accepted and must be presented at time of visit. ?Medicare does not cover dental. ?Forms of payment: Cash, credit card, checks. ?Best way to get seen: ?If not previously registered with the clinic, walk-in dental registration begins at 7:15 am and is on a first come/first serve basis. ?If previously registered with the clinic, call to make an appointment. ?  ?  ?[facility] ?[phone] ?[place] RESIDENTS ONLY ?  ?Location: ?[address], [place], [place] ?Clinic Hours: ?Mon-Thu 10a-2p ?Services: Extractions only! ?Payment Options: ?FREE (donations accepted) - bring proof of income or support ?Best way to get seen: ?Call and schedule an appointment OR come at 8am on the 1st Monday of every month (except for holidays) when it is first come/first served. ?  ?  ?[facility] ?[phone] ?  ?Location: ?[address] [place] ?Clinic Hours: ?Friday mornings ?Services, Payment Options, Best way to get seen: ?Call for info  ?

## 2021-08-20 ENCOUNTER — Ambulatory Visit
Admission: EM | Admit: 2021-08-20 | Discharge: 2021-08-20 | Disposition: A | Discharge status: Home / Self Care | Payer: Self-pay | Attending: Internal Medicine | Admitting: Internal Medicine

## 2021-08-20 ENCOUNTER — Encounter: Payer: Self-pay | Admitting: Emergency Medicine

## 2021-08-20 DIAGNOSIS — K0889 Other specified disorders of teeth and supporting structures: Secondary | ICD-10-CM

## 2021-08-20 MED ORDER — PENICILLIN V POTASSIUM 500 MG PO TABS: 500.0000 mg | ORAL_TABLET | Freq: Four times a day (QID) | ORAL | 0 refills | Status: AC

## 2021-08-20 MED ORDER — TRAMADOL HCL 50 MG PO TABS: 50.0000 mg | ORAL_TABLET | Freq: Four times a day (QID) | ORAL | 0 refills | Status: DC | PRN

## 2021-08-20 NOTE — ED Triage Notes (Signed)
Patient c/o tooth pain that started 2-3 days ago.  Patient denies fevers.

## 2021-08-20 NOTE — ED Provider Notes (Signed)
[name]    CSN: [id number] Arrival date & time: 08/20/21  1559      History   Chief Complaint Chief Complaint  Patient presents with   Dental Pain    HPI Michael Shaw is a 36 y.o. male who presents with onset of tooth pain x 2-3 days. Denies fever. Has a chipped molar on R upper area with 2 other molars that are gone. Has been having worse pain at night time when he lays down. Has been taking Ibuprofen 800 mg for pain.     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 No pertinent surgical history.     Home Medications    Prior to Admission medications   Medication Sig Start Date End Date Taking? Authorizing Provider  amLODipine (NORVASC) 5 MG tablet Take 5 mg by mouth daily.   Yes [provider]  penicillin v potassium (VEETID) 500 MG tablet Take 1 tablet (500 mg total) by mouth 4 (four) times daily for 10 days. 08/20/21 08/30/21 Yes Rodriguez-Southworth, Nettie Elm, [name]  traMADol (ULTRAM) 50 MG tablet Take 1 tablet (50 mg total) by mouth every 6 (six) hours as needed. 08/20/21  Yes Rodriguez-Southworth, Nettie Elm, [name]  dicyclomine (BENTYL) 10 MG capsule Take 1 capsule (10 mg total) by mouth 3 (three) times daily as needed for up to 5 days (for cramps). 07/25/17 02/11/19  Dionne Bucy, MD  fluticasone (FLONASE) 50 MCG/ACT nasal spray Place 2 sprays into both nostrils daily. 03/28/18 02/11/19  Enid Derry, [name]    Family History Family History  Problem Relation Age of Onset   Asthma Brother     Social History Social History   Tobacco Use   Smoking status: Former    Packs/day: 1.00    Years: 10.00    Total pack years: 10.00    Types: Cigarettes   Smokeless tobacco: Never  Vaping Use   Vaping Use: Every day   Devices: Non Nicotine.  Substance Use Topics   Alcohol  use: Yes    Comment: rare    Drug use: Yes    Types: Marijuana    Comment: last used last week     Allergies   Patient has no known allergies.   Review of Systems Review of Systems  Constitutional:  Negative for fever.  HENT:  Positive for dental problem.   Cardiovascular:        Has hx of HTN and has not taken his BP med yet, takes it in the pm's  Hematological:  Negative for adenopathy.     Physical Exam Triage Vital Signs ED Triage Vitals  Enc Vitals Group     BP 08/20/21 1616 (!) 145/104     Pulse Rate 08/20/21 1616 72     Resp 08/20/21 1616 15     Temp 08/20/21 1616 97.8 F (36.6 C)     Temp Source 08/20/21 1616 Oral     SpO2 08/20/21 1616 98 %     Weight 08/20/21 1613 (!) 310 lb (140.6 kg)     Height 08/20/21 1613 6\' 4"  (1.93 m)     Head Circumference --      Peak Flow --      Pain Score 08/20/21 1613 10     Pain Loc --      Pain  Edu? --      Excl. in GC? --    No data found.  Updated Vital Signs BP (!) 145/104 (BP Location: Right Arm)   Pulse 72   Temp 97.8 F (36.6 C) (Oral)   Resp 15   Ht 6\' 4"  (1.93 m)   Wt (!) 310 lb (140.6 kg)   SpO2 98%   BMI 37.73 kg/m   Visual Acuity Right Eye Distance:   Left Eye Distance:   Bilateral Distance:    Right Eye Near:   Left Eye Near:    Bilateral Near:     Physical Exam Vitals and nursing note reviewed.  Constitutional:      General: He is not in acute distress.    Appearance: He is obese. He is not toxic-appearing.  HENT:     Right Ear: Tympanic membrane and external ear normal.     Left Ear: Tympanic membrane and external ear normal.     Mouth/Throat:     Mouth: Mucous membranes are moist.      Comments: Chipped molar with small piece still present The molars behind it are completely gone, just has the roots left.  Eyes:     General: No scleral icterus.    Conjunctiva/sclera: Conjunctivae normal.  Pulmonary:     Effort: Pulmonary effort is normal.  Musculoskeletal:        General:  Normal range of motion.     Cervical back: Neck supple.  Lymphadenopathy:     Cervical: No cervical adenopathy.  Skin:    General: Skin is warm and dry.  Neurological:     Mental Status: He is alert and oriented to person, place, and time.     Gait: Gait normal.  Psychiatric:        Mood and Affect: Mood normal.        Behavior: Behavior normal.        Thought Content: Thought content normal.        Judgment: Judgment normal.      UC Treatments / Results  Labs (all labs ordered are listed, but only abnormal results are displayed) Labs Reviewed - No data to display  EKG   Radiology No results found.  Procedures Procedures (including critical care time)  Medications Ordered in UC Medications - No data to display  Initial Impression / Assessment and Plan / UC Course  I have reviewed the triage vital signs and the nursing notes.  Dental pain with possible infection I placed him on Penicillin and tramadol as noted.    Final Clinical Impressions(s) / UC Diagnoses   Final diagnoses:  Pain, dental     Discharge Instructions      Your blood pressure needs to be less than 140/90 Go to the dentist as soon as you can      ED Prescriptions     Medication Sig Dispense Auth. Provider   penicillin v potassium (VEETID) 500 MG tablet Take 1 tablet (500 mg total) by mouth 4 (four) times daily for 10 days. 40 tablet Rodriguez-Southworth, Sundeep Destin, [name]   traMADol (ULTRAM) 50 MG tablet Take 1 tablet (50 mg total) by mouth every 6 (six) hours as needed. 15 tablet Rodriguez-Southworth, , [name]      I have reviewed the PDMP during this encounter.   Nettie Elm, [name] 08/20/21 1652

## 2021-08-20 NOTE — Discharge Instructions (Signed)
Your blood pressure needs to be less than 140/90 Go to the dentist as soon as you can

## 2022-03-26 ENCOUNTER — Encounter: Payer: Self-pay | Admitting: Emergency Medicine

## 2022-03-26 ENCOUNTER — Ambulatory Visit
Admission: EM | Admit: 2022-03-26 | Discharge: 2022-03-26 | Disposition: A | Discharge status: Home / Self Care | Payer: Self-pay | Attending: Family Medicine | Admitting: Family Medicine

## 2022-03-26 DIAGNOSIS — R1032 Left lower quadrant pain: Secondary | ICD-10-CM | POA: Insufficient documentation

## 2022-03-26 LAB — URINALYSIS, ROUTINE W REFLEX MICROSCOPIC
Bilirubin Urine: NEGATIVE
Glucose, UA: NEGATIVE mg/dL
Hgb urine dipstick: NEGATIVE
Ketones, ur: NEGATIVE mg/dL
Leukocytes,Ua: NEGATIVE
Nitrite: NEGATIVE
Protein, ur: NEGATIVE mg/dL
Specific Gravity, Urine: <1.005 — ABNORMAL LOW (ref 1.005–1.030)
pH: 6 (ref 5.0–8.0)

## 2022-03-26 NOTE — ED Provider Notes (Signed)
[name]    CSN: [id number] Arrival date & time: 03/26/22  1452      History   Chief Complaint Chief Complaint  Patient presents with   Abdominal Pain    HPI Michael Shaw is a 37 y.o. male.   Patient presents today with a 24-hour history of discomfort in his left groin.  He reports that this travels across his suprapubic region but is mostly on the left side.  Yesterday pain was rated 7/8 on a 0-10 pain scale but has improved today and is currently 5, described as intermittent sharp pains, no aggravating or alleviating factors identified.  He does report an unusual sensation in this area with urination.  Denies any dysuria, urinary frequency, urinary urgency.  He denies any nausea, vomiting, diarrhea.  Reports he did have 1 episode of loose bowel movement 3 days ago but this has since resolved and denies any associated blood or mucus.  Reports his last bowel movement was earlier today and was normal.  Denies any suspicious food intake or dietary changes.  Denies any known sick contacts.  Denies history of gastrointestinal disorder including Crohn's, ulcerative colitis, diverticulitis.  Denies history of hernia or previous abdominal surgery; still has gallbladder and appendix.  He has been in a monogamous marriage for 14 years and has no concern for STI.  Denies any penile discharge.  Denies any recent heavy lifting or strenuous physical activity.    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 No pertinent surgical history.     Home Medications    Prior to Admission medications   Medication Sig Start Date End Date Taking? Authorizing Provider  amLODipine (NORVASC) 5 MG tablet Take 5 mg by mouth daily.   Yes [provider]  traMADol (ULTRAM) 50 MG tablet Take 1 tablet (50 mg total) by  mouth every 6 (six) hours as needed. 08/20/21   Rodriguez-Southworth, Sunday Spillers, [name]  dicyclomine (BENTYL) 10 MG capsule Take 1 capsule (10 mg total) by mouth 3 (three) times daily as needed for up to 5 days (for cramps). 07/25/17 02/11/19  Arta Silence, MD  fluticasone (FLONASE) 50 MCG/ACT nasal spray Place 2 sprays into both nostrils daily. 03/28/18 02/11/19  Laban Emperor, [name]    Family History Family History  Problem Relation Age of Onset   Asthma Brother     Social History Social History   Tobacco Use   Smoking status: Former    Packs/day: 1.00    Years: 10.00    Total pack years: 10.00    Types: Cigarettes   Smokeless tobacco: Never  Vaping Use   Vaping Use: Every day   Devices: Non Nicotine.  Substance Use Topics   Alcohol use: Yes    Comment: rare    Drug use: Yes    Types: Marijuana    Comment: last used last week     Allergies   Patient has no known allergies.   Review of Systems Review of Systems  Constitutional:  Negative for activity change, appetite change, fatigue and fever.  Respiratory:  Negative for cough and shortness of breath.   Cardiovascular:  Negative for chest pain.  Gastrointestinal:  Positive for abdominal pain. Negative for blood in stool, constipation, diarrhea, nausea and vomiting.  Genitourinary:  Negative for dysuria, flank pain, frequency,  hematuria and urgency.  Musculoskeletal:  Negative for arthralgias and myalgias.     Physical Exam Triage Vital Signs ED Triage Vitals  Enc Vitals Group     BP 03/26/22 1534 (!) 117/91     Pulse Rate 03/26/22 1534 82     Resp 03/26/22 1534 15     Temp 03/26/22 1534 98.3 F (36.8 C)     Temp Source 03/26/22 1534 Oral     SpO2 03/26/22 1534 100 %     Weight 03/26/22 1532 290 lb (131.5 kg)     Height 03/26/22 1532 6\' 4"  (1.93 m)     Head Circumference --      Peak Flow --      Pain Score 03/26/22 1531 7     Pain Loc --      Pain Edu? --      Excl. in GC? --    No data  found.  Updated Vital Signs BP (!) 117/91 (BP Location: Left Arm)   Pulse 82   Temp 98.3 F (36.8 C) (Oral)   Resp 15   Ht 6\' 4"  (1.93 m)   Wt 290 lb (131.5 kg)   SpO2 100%   BMI 35.30 kg/m   Visual Acuity Right Eye Distance:   Left Eye Distance:   Bilateral Distance:    Right Eye Near:   Left Eye Near:    Bilateral Near:     Physical Exam Vitals reviewed. Exam conducted with a chaperone present.  Constitutional:      General: He is awake.     Appearance: Normal appearance. He is well-developed. He is not ill-appearing.     Comments: Very pleasant male presented age in no acute distress sitting comfortably in exam room  HENT:     Head: Normocephalic and atraumatic.     Mouth/Throat:     Pharynx: No oropharyngeal exudate, posterior oropharyngeal erythema or uvula swelling.  Cardiovascular:     Rate and Rhythm: Normal rate and regular rhythm.     Heart sounds: Normal heart sounds, S1 normal and S2 normal. No murmur heard. Pulmonary:     Effort: Pulmonary effort is normal.     Breath sounds: Normal breath sounds. No stridor. No wheezing, rhonchi or rales.     Comments: Clear to auscultation bilaterally Abdominal:     General: Bowel sounds are normal.     Palpations: Abdomen is soft.     Tenderness: There is no abdominal tenderness. There is no right CVA tenderness, left CVA tenderness, guarding or rebound.     Hernia: There is no hernia in the left inguinal area or right inguinal area.     Comments: No significant tenderness palpation.  No evidence of acute abdomen on physical exam  Genitourinary:    Penis: Normal.      Testes:        Right: Mass, tenderness or swelling not present. Right testis is descended.        Left: Mass, tenderness or swelling not present. Left testis is descended.     Comments: CMA present as chaperone during exam. Lymphadenopathy:     Lower Body: No right inguinal adenopathy. No left inguinal adenopathy.  Neurological:     Mental Status:  He is alert.  Psychiatric:        Behavior: Behavior is cooperative.      UC Treatments / Results  Labs (all labs ordered are listed, but only abnormal results are displayed) Labs Reviewed  URINALYSIS, ROUTINE W REFLEX  MICROSCOPIC - Abnormal; Notable for the following components:      Result Value   Specific Gravity, Urine <1.005 (*)    All other components within normal limits    EKG   Radiology No results found.  Procedures Procedures (including critical care time)  Medications Ordered in UC Medications - No data to display  Initial Impression / Assessment and Plan / UC Course  I have reviewed the triage vital signs and the nursing notes.  Pertinent labs & imaging results that were available during my care of the patient were reviewed by me and considered in my medical decision making (see chart for details).     Patient is well-appearing, afebrile, nontoxic, nontachycardic.  Vital signs and physical exam are reassuring today; no indication for emergent evaluation or imaging.  UA was slightly dilute but otherwise no obvious abnormalities.  Low suspicion for nephrolithiasis given lack of flank/CVA tenderness and no hematuria on UA.  No identifiable hernia on exam but discussed that is possible that this could be contributing to his symptoms.  If he continues to have symptoms we discussed that he will ultimately need imaging such as ultrasound or CT which we do not have available in urgent care.  Recommended follow-up with primary care.  Offered STI testing but patient declined this as he has no concern for STI.  Encouraged him to drink plenty of fluid.  He can use over-the-counter analgesics as needed.  Recommended he avoid strenuous activity including heavy lifting.  We discussed that if he has any changing or worsening symptoms he needs to be seen immediately.  Strict return precautions given.  Final Clinical Impressions(s) / UC Diagnoses   Final diagnoses:  Left groin  pain     Discharge Instructions      Your exam is normal today.  It is possible that you have a hernia.  Avoid any strenuous activity including heavy lifting.  Make sure that you are drinking plenty fluid and you can use ibuprofen for pain relief.  Follow-up with your primary care as you may need to ensure that there is not hernia given that we did not feel one today.  Your urine was normal.  If you develop any additional symptoms you need to be seen immediately including severe abdominal pain, fever, nausea, vomiting.     ED Prescriptions   None    PDMP not reviewed this encounter.   Terrilee Croak, [name] 03/26/22 1648

## 2022-03-26 NOTE — Discharge Instructions (Signed)
Your exam is normal today.  It is possible that you have a hernia.  Avoid any strenuous activity including heavy lifting.  Make sure that you are drinking plenty fluid and you can use ibuprofen for pain relief.  Follow-up with your primary care as you may need to ensure that there is not hernia given that we did not feel one today.  Your urine was normal.  If you develop any additional symptoms you need to be seen immediately including severe abdominal pain, fever, nausea, vomiting.

## 2022-03-26 NOTE — ED Triage Notes (Signed)
Patient c/o lower mid abdominal pain that started yesterday.  Patient states that the pain is worse when he urinates.  Patient denies N/V.  Patient denies fevers or chills.

## 2022-07-18 ENCOUNTER — Other Ambulatory Visit: Payer: Self-pay

## 2022-07-18 ENCOUNTER — Ambulatory Visit
Admission: EM | Admit: 2022-07-18 | Discharge: 2022-07-18 | Disposition: A | Discharge status: Home / Self Care | Payer: Self-pay | Attending: Physician Assistant | Admitting: Physician Assistant

## 2022-07-18 DIAGNOSIS — K0889 Other specified disorders of teeth and supporting structures: Secondary | ICD-10-CM

## 2022-07-18 DIAGNOSIS — K047 Periapical abscess without sinus: Secondary | ICD-10-CM

## 2022-07-18 MED ORDER — HYDROCODONE-ACETAMINOPHEN 5-325 MG PO TABS: 2.0000 | ORAL_TABLET | Freq: Four times a day (QID) | ORAL | 0 refills | Status: AC | PRN

## 2022-07-18 MED ORDER — AMOXICILLIN-POT CLAVULANATE 875-125 MG PO TABS: 1.0000 | ORAL_TABLET | Freq: Two times a day (BID) | ORAL | 0 refills | Status: AC

## 2022-07-18 NOTE — ED Triage Notes (Signed)
Dental abscess on right side with swelling, fever and pain x 2 days

## 2022-07-18 NOTE — Discharge Instructions (Signed)
-  I sent antibiotics and pain meds, but you should ultimately follow up with dentist as soon as you can to have this treated further. Go to ER for fever, worse pain, increased facial swelling.

## 2022-07-18 NOTE — ED Provider Notes (Signed)
[name]    CSN: [id number] Arrival date & time: 07/18/22  1255      History   Chief Complaint Chief Complaint  Patient presents with   dental abcess    HPI Michael Shaw is a 37 y.o. male presenting for pain in the tooth of the right upper side as well as facial swelling for the past 2 days.  He says that he felt feverish the first day but did not recorded temperature.  Reports he has been taking high doses of OTC meds without relief.  He reports that he was eating something and then a piece of the tooth broke off.  He says this tooth has bothered him before.  Patient has been seen multiple times to urgent care and ED for dental abscesses.  Does not have a dentist.  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 No pertinent surgical history.     Home Medications    Prior to Admission medications   Medication Sig Start Date End Date Taking? Authorizing Provider  amoxicillin-clavulanate (AUGMENTIN) 875-125 MG tablet Take 1 tablet by mouth every 12 (twelve) hours for 7 days. 07/18/22 07/25/22 Yes Shirlee Latch, [name]  HYDROcodone-acetaminophen (NORCO/VICODIN) 5-325 MG tablet Take 2 tablets by mouth every 6 (six) hours as needed for up to 2 days for severe pain. 07/18/22 07/20/22 Yes Eusebio Friendly B, [name]  amLODipine (NORVASC) 5 MG tablet Take 5 mg by mouth daily.    [provider]  dicyclomine (BENTYL) 10 MG capsule Take 1 capsule (10 mg total) by mouth 3 (three) times daily as needed for up to 5 days (for cramps). 07/25/17 02/11/19  Dionne Bucy, MD  fluticasone (FLONASE) 50 MCG/ACT nasal spray Place 2 sprays into both nostrils daily. 03/28/18 02/11/19  Enid Derry, [name]    Family History Family History  Problem Relation Age of Onset   Asthma Brother     Social  History Social History   Tobacco Use   Smoking status: Former    Packs/day: 1.00    Years: 10.00    Additional pack years: 0.00    Total pack years: 10.00    Types: Cigarettes   Smokeless tobacco: Never  Vaping Use   Vaping Use: Every day   Devices: Non Nicotine.  Substance Use Topics   Alcohol use: Yes    Comment: rare    Drug use: Yes    Types: Marijuana    Comment: last used last week     Allergies   Patient has no known allergies.   Review of Systems Review of Systems  Constitutional:  Negative for fatigue and fever.  HENT:  Positive for dental problem and facial swelling. Negative for trouble swallowing.   Hematological:  Negative for adenopathy.     Physical Exam Triage Vital Signs ED Triage Vitals  Enc Vitals Group     BP 07/18/22 1309 (!) 151/100     Pulse Rate 07/18/22 1309 60     Resp 07/18/22 1309 20     Temp 07/18/22 1309 98.1 F (36.7 C)     Temp Source 07/18/22 1309 Oral     SpO2 07/18/22 1309 100 %     Weight --      Height --      Head Circumference --  Peak Flow --      Pain Score 07/18/22 1306 10     Pain Loc --      Pain Edu? --      Excl. in GC? --    No data found.  Updated Vital Signs BP (!) 151/100   Pulse 60   Temp 98.1 F (36.7 C) (Oral)   Resp 20   SpO2 100%   Physical Exam Vitals and nursing note reviewed.  Constitutional:      General: He is not in acute distress.    Appearance: Normal appearance. He is well-developed. He is not ill-appearing.  HENT:     Head: Normocephalic and atraumatic.     Nose: Nose normal.     Mouth/Throat:     Mouth: Mucous membranes are moist.     Dentition: Dental tenderness, gingival swelling and dental caries present.     Pharynx: Oropharynx is clear.      Comments: Multiple dental caries.  The teeth circled in picture above are decayed, broken and there is surrounding swelling of the gingiva and tenderness. Eyes:     General: No scleral icterus.    Conjunctiva/sclera:  Conjunctivae normal.  Cardiovascular:     Rate and Rhythm: Normal rate and regular rhythm.     Heart sounds: Normal heart sounds.  Pulmonary:     Effort: Pulmonary effort is normal. No respiratory distress.     Breath sounds: Normal breath sounds.  Musculoskeletal:     Cervical back: Neck supple.  Skin:    General: Skin is warm and dry.     Capillary Refill: Capillary refill takes less than 2 seconds.  Neurological:     General: No focal deficit present.     Mental Status: He is alert. Mental status is at baseline.     Motor: No weakness.     Gait: Gait normal.  Psychiatric:        Mood and Affect: Mood normal.        Behavior: Behavior normal.      UC Treatments / Results  Labs (all labs ordered are listed, but only abnormal results are displayed) Labs Reviewed - No data to display  EKG   Radiology No results found.  Procedures Procedures (including critical care time)  Medications Ordered in UC Medications - No data to display  Initial Impression / Assessment and Plan / UC Course  I have reviewed the triage vital signs and the nursing notes.  Pertinent labs & imaging results that were available during my care of the patient were reviewed by me and considered in my medical decision making (see chart for details).   37 year old male presents for pain of teeth of right upper side for the past couple days after a tooth broke.  He does have obvious signs of dental infection without abscess.  Treating at this time with Augmentin.  Norco given for severe pain since he has been taking high doses of OTC meds without relief.  Supportive care encouraged.  ED precautions discussed.  Discussed the importance of following up with a dentist.   Final Clinical Impressions(s) / UC Diagnoses   Final diagnoses:  Dental infection  Pain, dental     Discharge Instructions      -I sent antibiotics and pain meds, but you should ultimately follow up with dentist as soon as you can  to have this treated further. Go to ER for fever, worse pain, increased facial swelling.    ED Prescriptions  Medication Sig Dispense Auth. Provider   amoxicillin-clavulanate (AUGMENTIN) 875-125 MG tablet Take 1 tablet by mouth every 12 (twelve) hours for 7 days. 14 tablet Eusebio Friendly B, [name]   HYDROcodone-acetaminophen (NORCO/VICODIN) 5-325 MG tablet Take 2 tablets by mouth every 6 (six) hours as needed for up to 2 days for severe pain. 6 tablet Shirlee Latch, [name]      I have reviewed the PDMP during this encounter.   Shirlee Latch, [name] 07/18/22 1343
